# Patient Record
Sex: Female | Born: 1985
Health system: Southern US, Community
[De-identification: ages and names within clinical notes are randomized; demographics above are authoritative.]

## PROBLEM LIST (undated history)

## (undated) ENCOUNTER — Inpatient Hospital Stay (HOSPITAL_COMMUNITY): Payer: Self-pay

## (undated) DIAGNOSIS — F419 Anxiety disorder, unspecified: Secondary | ICD-10-CM

## (undated) DIAGNOSIS — H9190 Unspecified hearing loss, unspecified ear: Secondary | ICD-10-CM

## (undated) DIAGNOSIS — J329 Chronic sinusitis, unspecified: Secondary | ICD-10-CM

## (undated) DIAGNOSIS — T7840XA Allergy, unspecified, initial encounter: Secondary | ICD-10-CM

## (undated) HISTORY — DX: Allergy, unspecified, initial encounter: T78.40XA

## (undated) HISTORY — PX: MYOMECTOMY: SHX85

## (undated) HISTORY — DX: Chronic sinusitis, unspecified: J32.9

## (undated) HISTORY — DX: Unspecified hearing loss, unspecified ear: H91.90

## (undated) HISTORY — DX: Anxiety disorder, unspecified: F41.9

## (undated) HISTORY — PX: INNER EAR SURGERY: SHX679

---

## 2016-12-05 ENCOUNTER — Ambulatory Visit
Admission: EM | Admit: 2016-12-05 | Discharge: 2016-12-05 | Disposition: A | Discharge status: Home / Self Care | Payer: BLUE CROSS/BLUE SHIELD | Attending: Family Medicine | Admitting: Family Medicine

## 2016-12-05 ENCOUNTER — Encounter: Payer: Self-pay | Admitting: *Deleted

## 2016-12-05 DIAGNOSIS — H9201 Otalgia, right ear: Secondary | ICD-10-CM | POA: Diagnosis not present

## 2016-12-05 DIAGNOSIS — J0100 Acute maxillary sinusitis, unspecified: Secondary | ICD-10-CM

## 2016-12-05 MED ORDER — AMOXICILLIN-POT CLAVULANATE 875-125 MG PO TABS: 1.0000 | ORAL_TABLET | Freq: Two times a day (BID) | ORAL | 0 refills | Status: AC

## 2016-12-05 NOTE — ED Provider Notes (Signed)
CSN: [id number]     Arrival date & time 12/05/16  1655 History   First MD Initiated Contact with Patient 12/05/16 1722     Chief Complaint  Patient presents with  . Facial Pain   (Consider location/radiation/quality/duration/timing/severity/associated sxs/prior Treatment) Patient is a 31 year old female with no pertinent past history who presents to the clinic with for 5 day history of worsening sinus symptoms. Patient reports that the symptoms started initially as mild allergy symptoms on Thursday but they have progressed and worsened since then. Patient reported "having muffled hearing with ringing to the right ear today as well as mild pain in the right side teeth that began today as well. Patient reports positive pressure to the right side of face as well. Patient reports that these symptoms are very similar to her course and presentation with the last sinus infection she had last August or September. Patient reports that she has been taking Zyrtec and ibuprofen. Patient denies fever, chills, chest pain, and shortness of breath.      History reviewed. No pertinent past medical history. History reviewed. No pertinent surgical history. History reviewed. No pertinent family history. Social History  Substance Use Topics  . Smoking status: Never Smoker  . Smokeless tobacco: Never Used  . Alcohol use Yes   OB History    No data available     Review of Systems  Constitutional: Negative for chills and fever.  HENT: Positive for ear pain, sinus pain and sinus pressure.        Pain to teeth on the right side. Muffled hearing and ringing on the right side  Eyes: Positive for pain. Negative for itching.  Respiratory: Negative for cough and shortness of breath.   All other systems reviewed and are negative.   Allergies  Patient has no known allergies.  Home Medications   Prior to Admission medications   Medication Sig Start Date End Date Taking? Authorizing Provider   amoxicillin-clavulanate (AUGMENTIN) 875-125 MG tablet Take 1 tablet by mouth every 12 (twelve) hours. 12/05/16 12/12/16  Luvenia Redden, PA-C   Meds Ordered and Administered this Visit  Medications - No data to display  BP (!) 135/92 (BP Location: Left Arm)   Pulse 95   Temp 98 F (36.7 C) (Oral)   Resp 16   Ht 5\' 4"  (1.626 m)   Wt 181 lb (82.1 kg)   LMP 11/21/2016 (Exact Date)   SpO2 100%   BMI 31.07 kg/m  No data found.   Physical Exam  Constitutional: She is oriented to person, place, and time. She appears well-developed and well-nourished. No distress.  HENT:  Head: Normocephalic and atraumatic.  Right Ear: External ear and ear canal normal. Tympanic membrane is scarred and bulging. Tympanic membrane is not injected.  Left Ear: External ear and ear canal normal. Tympanic membrane is scarred.  Nose: Right sinus exhibits maxillary sinus tenderness and frontal sinus tenderness. Left sinus exhibits no maxillary sinus tenderness and no frontal sinus tenderness.  Mouth/Throat: Uvula is midline and oropharynx is clear and moist. No posterior oropharyngeal erythema.  Eyes: EOM are normal. Pupils are equal, round, and reactive to light.  Neck: Normal range of motion.  Cardiovascular: Normal rate and regular rhythm.   Pulmonary/Chest: Effort normal and breath sounds normal. No respiratory distress. She has no wheezes.  Abdominal: Soft.  Musculoskeletal: Normal range of motion.  Lymphadenopathy:    She has no cervical adenopathy.  Neurological: She is oriented to person, place, and time.  Skin: Skin  is warm and dry.  Psychiatric: She has a normal mood and affect. Her behavior is normal.    Urgent Care Course     Procedures (including critical care time)  Labs Review Labs Reviewed - No data to display  Imaging Review No results found.   MDM   1. Otalgia, right   2. Acute maxillary sinusitis, recurrence not specified    START taking these medications   Details   amoxicillin-clavulanate (AUGMENTIN) 875-125 MG tablet Take 1 tablet by mouth every 12 (twelve) hours., Starting Wed 12/05/2016, Until Wed 12/12/2016, Normal       Patient with symptoms and exam consistent with sinusitis that has been worsening over several days. Patient has been taking Zyrtec and ibuprofen with minimal improvement in symptoms. Given the progressive symptoms patient given a prescription for Augmentin for a 10 day course. Patient also advised she could take over-the-counter Flonase or Afrin for symptom management as well as over-the-counter nasal saline spray to help clear her sinus congestion. Patient advised to return to clinic or to PCP should her symptoms worsen or not improve. Patient verbalized understanding is in agreement with plan.  Luvenia Redden, PA-C    Luvenia Redden, PA-C 12/05/16 [phone]

## 2016-12-05 NOTE — ED Triage Notes (Signed)
Right sided facial pain, right ear is sore and muted. Hx of previous sinus infection that was similar.

## 2016-12-05 NOTE — Discharge Instructions (Signed)
-   Augmentin. 1 tablet twice a day until completed - can use OTC nasal sprays (Afrin and Flonase) for URI symptoms - can use OTC nasal saline spray to help clear our sinus congestion (attached) - continue home Zyrtec - continue ibuprofen or Tylenol for pain

## 2017-09-30 DIAGNOSIS — J0110 Acute frontal sinusitis, unspecified: Secondary | ICD-10-CM | POA: Diagnosis not present

## 2017-09-30 DIAGNOSIS — J0100 Acute maxillary sinusitis, unspecified: Secondary | ICD-10-CM | POA: Diagnosis not present

## 2017-10-17 DIAGNOSIS — S93501A Unspecified sprain of right great toe, initial encounter: Secondary | ICD-10-CM | POA: Diagnosis not present

## 2017-10-17 DIAGNOSIS — M79671 Pain in right foot: Secondary | ICD-10-CM | POA: Diagnosis not present

## 2017-12-05 ENCOUNTER — Other Ambulatory Visit: Payer: Self-pay

## 2017-12-05 ENCOUNTER — Emergency Department: Payer: BLUE CROSS/BLUE SHIELD

## 2017-12-05 ENCOUNTER — Emergency Department
Admission: EM | Admit: 2017-12-05 | Discharge: 2017-12-05 | Disposition: A | Discharge status: Home / Self Care | Payer: BLUE CROSS/BLUE SHIELD | Attending: Emergency Medicine | Admitting: Emergency Medicine

## 2017-12-05 ENCOUNTER — Encounter: Payer: Self-pay | Admitting: Emergency Medicine

## 2017-12-05 DIAGNOSIS — Y9389 Activity, other specified: Secondary | ICD-10-CM | POA: Diagnosis not present

## 2017-12-05 DIAGNOSIS — R2232 Localized swelling, mass and lump, left upper limb: Secondary | ICD-10-CM | POA: Diagnosis not present

## 2017-12-05 DIAGNOSIS — Y929 Unspecified place or not applicable: Secondary | ICD-10-CM | POA: Insufficient documentation

## 2017-12-05 DIAGNOSIS — S40022A Contusion of left upper arm, initial encounter: Secondary | ICD-10-CM | POA: Diagnosis not present

## 2017-12-05 DIAGNOSIS — Y999 Unspecified external cause status: Secondary | ICD-10-CM | POA: Diagnosis not present

## 2017-12-05 DIAGNOSIS — T148XXA Other injury of unspecified body region, initial encounter: Secondary | ICD-10-CM

## 2017-12-05 DIAGNOSIS — S4982XA Other specified injuries of left shoulder and upper arm, initial encounter: Secondary | ICD-10-CM | POA: Diagnosis present

## 2017-12-05 DIAGNOSIS — X58XXXA Exposure to other specified factors, initial encounter: Secondary | ICD-10-CM | POA: Diagnosis not present

## 2017-12-05 MED ORDER — OXYCODONE-ACETAMINOPHEN 5-325 MG PO TABS
1.0000 | ORAL_TABLET | Freq: Once | ORAL | Status: AC
  Administered 2017-12-05: 1 via ORAL
  Filled 2017-12-05: qty 1

## 2017-12-05 NOTE — ED Provider Notes (Signed)
[facility] Emergency Department Provider Note  ____________________________________________   First MD Initiated Contact with Patient 12/05/17 1105     (approximate)  I have reviewed the triage vital signs and the nursing notes.   HISTORY  Chief Complaint Arm Injury    HPI Denessa Cavan is a 32 y.o. female who presents emergency department.  She is concerned about the large bruise since on the left upper arm.  She states she donated plasma on Tuesday and the bruising and swelling has gotten much worse.  She is concerned that the area might still be actively bleeding.  She denies any fever or chills at this time.  States area does feel warm to touch.'s are tingling.  She denies any IV drug use  History reviewed. No pertinent past medical history.  There are no active problems to display for this patient.   History reviewed. No pertinent surgical history.  Prior to Admission medications   Not on File    Allergies Patient has no known allergies.  No family history on file.  Social History Social History   Tobacco Use  . Smoking status: Never Smoker  . Smokeless tobacco: Never Used  Substance Use Topics  . Alcohol use: Yes  . Drug use: Not on file    Review of Systems  Constitutional: No fever/chills Eyes: No visual changes. ENT: No sore throat. Respiratory: Denies cough Genitourinary: Negative for dysuria. Musculoskeletal: Negative for back pain. Skin: Negative for rash.  Positive for large amount of bruising on the left upper arm    ____________________________________________   PHYSICAL EXAM:  VITAL SIGNS: ED Triage Vitals  Enc Vitals Group     BP 12/05/17 1100 130/72     Pulse Rate 12/05/17 1100 79     Resp --      Temp 12/05/17 1100 98.4 F (36.9 C)     Temp Source 12/05/17 1100 Oral     SpO2 12/05/17 1100 99 %     Weight 12/05/17 1100 202 lb (91.6 kg)     Height 12/05/17 1100 5\' 4"  (1.626 m)     Head  Circumference --      Peak Flow --      Pain Score 12/05/17 1044 7     Pain Loc --      Pain Edu? --      Excl. in [place]? --     Constitutional: Alert and oriented. Well appearing and in no acute distress. Eyes: Conjunctivae are normal.  Head: Atraumatic. Nose: No congestion/rhinnorhea. Mouth/Throat: Mucous membranes are moist.   Cardiovascular: Normal rate, regular rhythm.  Heart sounds are normal Respiratory: Normal respiratory effort.  No retractions, lungs clear all station GU: deferred Musculoskeletal: FROM all extremities, warm and well perfused.  The left upper arm is swollen and bruised from the antecubital to the bicep and around to the back of the arm.  She has full range of motion of the arm.  She is neurovascularly intact. Neurologic:  Normal speech and language.  Skin:  Skin is warm, dry and intact. No rash noted.  Positive for large hematoma in the left upper arm.  No track marks or IV marks are noted. Psychiatric: Mood and affect are normal. Speech and behavior are normal.  ____________________________________________   LABS (all labs ordered are listed, but only abnormal results are displayed)  Labs Reviewed - No data to display ____________________________________________   ____________________________________________  RADIOLOGY  Ultrasound of the left upper arm shows a hematoma but the  vascularity is intact  ____________________________________________   PROCEDURES  Procedure(s) performed: No  Procedures    ____________________________________________   INITIAL IMPRESSION / ASSESSMENT AND PLAN / ED COURSE  Pertinent labs & imaging results that were available during my care of the patient were reviewed by me and considered in my medical decision making (see chart for details).  Patient is a 32 year old female presented emergency department complaining of left arm pain.  She donated plasma 2 days ago and now has a very large bruise.  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  She states she was very concerned about the cleanliness and also that they did not perform the procedure as well as the way she used to visit.  On physical exam the left upper arm has a very large hematoma which is warm to touch.  Ultrasound of the left arm shows a hematoma  Explained the ultrasound results to the patient.  Also instructed her to return if the area is worsening.  Explained to her that a DVT may not be present now but could develop within a week.  She states she understands will comply with her instructions.  She is to apply ice to the area and then in 2 days she can start using warm compresses.  She is to follow-up with her regular doctor or orthopedics if the area becomes jellylike so it could be drained.  She states she understands most discharged in stable condition     As part of my medical decision making, I reviewed the following data within the [place] notes reviewed and incorporated, Radiograph reviewed ultrasound of the left arm shows a hematoma, Notes from prior ED visits and [place] Controlled Substance Database  ____________________________________________   FINAL CLINICAL IMPRESSION(S) / ED DIAGNOSES  Final diagnoses:  Hematoma      NEW MEDICATIONS STARTED DURING THIS VISIT:  There are no discharge medications for this patient.    Note:  This document was prepared using Dragon voice recognition software and may include unintentional dictation errors.    Versie Starks, PA-C 12/05/17 1314    Earleen Newport, MD 12/05/17 [phone]

## 2017-12-05 NOTE — Discharge Instructions (Addendum)
Follow-up with your regular doctor if you are not better in 2 to 3 days.  If the arm is worsening and becomes more red and swollen please return to the emergency department.  Take over-the-counter pain medication as needed.  Apply ice for 2 days.  Then you may change to warm compresses.

## 2017-12-05 NOTE — ED Notes (Addendum)
See triage note    Having pain to left arm   Large bruised area with swelling noted to left upper arm  States she donated plasma recently

## 2017-12-05 NOTE — ED Triage Notes (Signed)
dontated plasma.  Since then arm increasing swelling around area--left arm

## 2018-10-14 ENCOUNTER — Telehealth: Payer: Self-pay

## 2018-10-14 NOTE — Telephone Encounter (Signed)
Copied from [place] [id number]. Topic: Appointment Scheduling - New Patient >> Oct 14, 2018  9:35 AM Virl Axe D wrote: New patient has been scheduled for your office. Provider: Aundra Dubin Date of Appointment: 12/12/18  Route to department's PEC pool.

## 2018-12-12 ENCOUNTER — Ambulatory Visit: Payer: BLUE CROSS/BLUE SHIELD | Admitting: Internal Medicine

## 2019-01-14 ENCOUNTER — Other Ambulatory Visit: Payer: Self-pay

## 2019-01-14 ENCOUNTER — Encounter: Payer: Self-pay | Admitting: Internal Medicine

## 2019-01-14 ENCOUNTER — Ambulatory Visit (INDEPENDENT_AMBULATORY_CARE_PROVIDER_SITE_OTHER): Payer: BLUE CROSS/BLUE SHIELD | Admitting: Internal Medicine

## 2019-01-14 DIAGNOSIS — H9193 Unspecified hearing loss, bilateral: Secondary | ICD-10-CM | POA: Insufficient documentation

## 2019-01-14 DIAGNOSIS — J309 Allergic rhinitis, unspecified: Secondary | ICD-10-CM

## 2019-01-14 DIAGNOSIS — N946 Dysmenorrhea, unspecified: Secondary | ICD-10-CM | POA: Insufficient documentation

## 2019-01-14 DIAGNOSIS — J320 Chronic maxillary sinusitis: Secondary | ICD-10-CM

## 2019-01-14 DIAGNOSIS — Z1389 Encounter for screening for other disorder: Secondary | ICD-10-CM

## 2019-01-14 DIAGNOSIS — Z30431 Encounter for routine checking of intrauterine contraceptive device: Secondary | ICD-10-CM

## 2019-01-14 DIAGNOSIS — Z124 Encounter for screening for malignant neoplasm of cervix: Secondary | ICD-10-CM

## 2019-01-14 DIAGNOSIS — E559 Vitamin D deficiency, unspecified: Secondary | ICD-10-CM

## 2019-01-14 DIAGNOSIS — H1013 Acute atopic conjunctivitis, bilateral: Secondary | ICD-10-CM

## 2019-01-14 DIAGNOSIS — Z1329 Encounter for screening for other suspected endocrine disorder: Secondary | ICD-10-CM

## 2019-01-14 DIAGNOSIS — Z0000 Encounter for general adult medical examination without abnormal findings: Secondary | ICD-10-CM

## 2019-01-14 DIAGNOSIS — Z13220 Encounter for screening for lipoid disorders: Secondary | ICD-10-CM

## 2019-01-14 MED ORDER — MONTELUKAST SODIUM 10 MG PO TABS: 10.0000 mg | ORAL_TABLET | Freq: Every day | ORAL | 3 refills | Status: DC

## 2019-01-14 MED ORDER — OLOPATADINE HCL 0.2 % OP SOLN: 1.0000 [drp] | Freq: Every day | OPHTHALMIC | 7 refills | Status: DC

## 2019-01-14 NOTE — Progress Notes (Signed)
Virtual Visit via Video Note  I connected with Nichole Cain  on 01/14/19 at  9:54 AM EDT by a video enabled telemedicine application and verified that I am speaking with the correct person using two identifiers.  Location patient: home Location provider:work  Persons participating in the virtual visit: patient, provider  I discussed the limitations of evaluation and management by telemedicine and the availability of in person appointments. The patient expressed understanding and agreed to proceed.   HPI: Allergic  both eyes c/o watery eyes daily using otc [facility] allergy eye drops w/o relief will try  Allergic rhinitis/right maxillary sinusitis unspecified trigger -prn zyrtec 10 mg qd is not helping she did not have issues with sinus on right side flaring until moved to [facility]. She reports right cheek sinus flares 2-3 x per year.   Loss of hearing will refer to ENT in [place] needs audiology check has been 2 years wears hearing aids s/p surgery for tubes in ear 9x and skin grafts on b/l ear drums   IUD check up feels not in placed due to change in 2023. Also due for pap smear and has painful menses despite IUD    ROS: See pertinent positives and negatives per HPI. General: no weight change, no fever  HEENT: no sore throat, b/l hearing loss + CV: no chest pain  Lungs: no sob Ab: no pain GU: painful menses + painful IUD+ MSK: no joint pain Psych: no depression/anxiety   Past Medical History:  Diagnosis Date  . Allergy   . Hearing loss    wears hearing aids b/l   . Sinusitis    right maxillary    Past Surgical History:  Procedure Laterality Date  . INNER EAR SURGERY     tubes x 9 and skin grafts in ear drums    Family History  Problem Relation Age of Onset  . Anxiety disorder Mother   . Depression Mother   . Heart disease Mother        MI  . Hyperlipidemia Mother   . Hypertension Mother   . Heart disease Maternal Grandfather        CHF  . Cancer Paternal Grandfather         testicular cancer    SOCIAL HX:  [place] [facility] office  No kids  Single  Mom is Glenna Fellows  Former smoker from 24s x 10 + years max 1 pk in 2 days to 1 ppd  Born in [place] lived in [place] but moved back to [place]    Current Outpatient Medications:  .  cetirizine (ZYRTEC) 10 MG tablet, Take 10 mg by mouth daily as needed for allergies., Disp: , Rfl:  .  montelukast (SINGULAIR) 10 MG tablet, Take 1 tablet (10 mg total) by mouth at bedtime., Disp: 90 tablet, Rfl: 3 .  Olopatadine HCl (PATADAY) 0.2 % SOLN, Apply 1 drop to eye daily. Max, Disp: 2.5 mL, Rfl: 7  EXAM:  VITALS per patient if applicable:  GENERAL: alert, oriented, appears well and in no acute distress  HEENT: atraumatic, conjunttiva clear, no obvious abnormalities on inspection of external nose and ears  NECK: normal movements of the head and neck  LUNGS: on inspection no signs of respiratory distress, breathing rate appears normal, no obvious gross SOB, gasping or wheezing  CV: no obvious cyanosis  MS: moves all visible extremities without noticeable abnormality  PSYCH/NEURO: pleasant and cooperative, no obvious depression or anxiety, speech and thought processing grossly intact  ASSESSMENT AND PLAN:  Discussed the following assessment and plan:  Allergic conjunctivitis of both eyes - Plan: using otc [facility] allergy eye drops w/o relief will try Olopatadine HCl (PATADAY) 0.2 % SOLN Allergic rhinitis, unspecified seasonality, unspecified trigger - Plan: montelukast (SINGULAIR) 10 MG tablet, prn zyrtec 10 mg qd  H/o sinusitis right maxillary flares 2-3 x per year   Loss of hearing will refer to ENT in [place] needs audiology check has been 2 years wears hearing aids  IUD check up feels not in placed due to change in 2023 - Plan: Ambulatory referral to Obstetrics / Gynecology Dr. Leonides Schanz or [facility] also due for pap  Routine cervical smear - Plan: Ambulatory referral to Obstetrics / Gynecology Painful menses    HM utd flu shot  Tdap had in 2015 in [place] per pt  Declines STD check   sch fasting labs  Pap referred [facility] and also to check IUD placement no h/o abnormal pap   I discussed the assessment and treatment plan with the patient. The patient was provided an opportunity to ask questions and all were answered. The patient agreed with the plan and demonstrated an understanding of the instructions.   The patient was advised to call back or seek an in-person evaluation if the symptoms worsen or if the condition fails to improve as anticipated.  Time spent 25 minutes Delorise Jackson, MD

## 2019-01-15 NOTE — Patient Instructions (Signed)
Sinusitis, Adult Sinusitis is inflammation of your sinuses. Sinuses are hollow spaces in the bones around your face. Your sinuses are located:  Around your eyes.  In the middle of your forehead.  Behind your nose.  In your cheekbones. Mucus normally drains out of your sinuses. When your nasal tissues become inflamed or swollen, mucus can become trapped or blocked. This allows bacteria, viruses, and fungi to grow, which leads to infection. Most infections of the sinuses are caused by a virus. Sinusitis can develop quickly. It can last for up to 4 weeks (acute) or for more than 12 weeks (chronic). Sinusitis often develops after a cold. What are the causes? This condition is caused by anything that creates swelling in the sinuses or stops mucus from draining. This includes:  Allergies.  Asthma.  Infection from bacteria or viruses.  Deformities or blockages in your nose or sinuses.  Abnormal growths in the nose (nasal polyps).  Pollutants, such as chemicals or irritants in the air.  Infection from fungi (rare). What increases the risk? You are more likely to develop this condition if you:  Have a weak body defense system (immune system).  Do a lot of swimming or diving.  Overuse nasal sprays.  Smoke. What are the signs or symptoms? The main symptoms of this condition are pain and a feeling of pressure around the affected sinuses. Other symptoms include:  Stuffy nose or congestion.  Thick drainage from your nose.  Swelling and warmth over the affected sinuses.  Headache.  Upper toothache.  A cough that may get worse at night.  Extra mucus that collects in the throat or the back of the nose (postnasal drip).  Decreased sense of smell and taste.  Fatigue.  A fever.  Sore throat.  Bad breath. How is this diagnosed? This condition is diagnosed based on:  Your symptoms.  Your medical history.  A physical exam.  Tests to find out if your condition is  acute or chronic. This may include: ? Checking your nose for nasal polyps. ? Viewing your sinuses using a device that has a light (endoscope). ? Testing for allergies or bacteria. ? Imaging tests, such as an MRI or CT scan. In rare cases, a bone biopsy may be done to rule out more serious types of fungal sinus disease. How is this treated? Treatment for sinusitis depends on the cause and whether your condition is chronic or acute.  If caused by a virus, your symptoms should go away on their own within 10 days. You may be given medicines to relieve symptoms. They include: ? Medicines that shrink swollen nasal passages (topical intranasal decongestants). ? Medicines that treat allergies (antihistamines). ? A spray that eases inflammation of the nostrils (topical intranasal corticosteroids). ? Rinses that help get rid of thick mucus in your nose (nasal saline washes).  If caused by bacteria, your health care provider may recommend waiting to see if your symptoms improve. Most bacterial infections will get better without antibiotic medicine. You may be given antibiotics if you have: ? A severe infection. ? A weak immune system.  If caused by narrow nasal passages or nasal polyps, you may need to have surgery. Follow these instructions at home: Medicines  Take, use, or apply over-the-counter and prescription medicines only as told by your health care provider. These may include nasal sprays.  If you were prescribed an antibiotic medicine, take it as told by your health care provider. Do not stop taking the antibiotic even if you start   to feel better. Hydrate and humidify   Drink enough fluid to keep your urine pale yellow. Staying hydrated will help to thin your mucus.  Use a cool mist humidifier to keep the humidity level in your home above 50%.  Inhale steam for 10-15 minutes, 3-4 times a day, or as told by your health care provider. You can do this in the bathroom while a hot shower is  running.  Limit your exposure to cool or dry air. Rest  Rest as much as possible.  Sleep with your head raised (elevated).  Make sure you get enough sleep each night. General instructions   Apply a warm, moist washcloth to your face 3-4 times a day or as told by your health care provider. This will help with discomfort.  Wash your hands often with soap and water to reduce your exposure to germs. If soap and water are not available, use hand sanitizer.  Do not smoke. Avoid being around people who are smoking (secondhand smoke).  Keep all follow-up visits as told by your health care provider. This is important. Contact a health care provider if:  You have a fever.  Your symptoms get worse.  Your symptoms do not improve within 10 days. Get help right away if:  You have a severe headache.  You have persistent vomiting.  You have severe pain or swelling around your face or eyes.  You have vision problems.  You develop confusion.  Your neck is stiff.  You have trouble breathing. Summary  Sinusitis is soreness and inflammation of your sinuses. Sinuses are hollow spaces in the bones around your face.  This condition is caused by nasal tissues that become inflamed or swollen. The swelling traps or blocks the flow of mucus. This allows bacteria, viruses, and fungi to grow, which leads to infection.  If you were prescribed an antibiotic medicine, take it as told by your health care provider. Do not stop taking the antibiotic even if you start to feel better.  Keep all follow-up visits as told by your health care provider. This is important. This information is not intended to replace advice given to you by your health care provider. Make sure you discuss any questions you have with your health care provider. Document Released: 07/30/2005 Document Revised: 12/30/2017 Document Reviewed: 12/30/2017 Elsevier Interactive Patient Education  2019 Elsevier  Inc.  Dysmenorrhea  Dysmenorrhea refers to cramps caused by the muscles of the uterus tightening (contracting) during a menstrual period. Dysmenorrhea may be mild, or it may be severe enough to interfere with everyday activities for a few days each month. Primary dysmenorrhea is menstrual cramps that last a couple of days when you start having menstrual periods or soon after. This often begins after a teenager starts having her period. As a woman gets older or has a baby, the cramps will usually lessen or disappear. Secondary dysmenorrhea begins later in life and is caused by a disorder in the reproductive system. It lasts longer, and it may cause more pain than primary dysmenorrhea. The pain may start before the period and last a few days after the period. What are the causes? Dysmenorrhea is usually caused by an underlying problem, such as:  The tissue that lines the uterus (endometrium) growing outside of the uterus in other areas of the body (endometriosis).  Endometrial tissue growing into the muscular walls of the uterus (adenomyosis).  Blood vessels in the pelvis becoming filled with blood just before the menstrual period (pelvic congestive syndrome).  Overgrowth of cells (polyps) in the endometrium or the lower part of the uterus (cervix).  The uterus dropping down into the vagina (prolapse) due to stretched or weak muscles.  Bladder problems, such as infection or inflammation.  Intestinal problems, such as a tumor or irritable bowel syndrome.  Cancer of the reproductive organs or bladder.  A severely tipped uterus.  A cervix that is closed or has a very small opening.  Noncancerous (benign) tumors of the uterus (fibroids).  Pelvic inflammatory disease (PID).  Pelvic scarring (adhesions) from a previous surgery.  An ovarian cyst.  An IUD (intrauterine device). What increases the risk? You are more likely to develop this condition if:  You are younger than age  62.  You started puberty early.  You have irregular or heavy bleeding.  You have never given birth.  You have a family history of dysmenorrhea.  You smoke. What are the signs or symptoms? Symptoms of this condition include:  Cramping, throbbing pain, or a feeling of fullness in the lower abdomen.  Lower back pain.  Periods lasting for longer than 7 days.  Headaches.  Bloating.  Fatigue.  Nausea or vomiting.  Diarrhea.  Sweating or dizziness.  Loose stools. How is this diagnosed? This condition may be diagnosed based on:  Your symptoms.  Your medical history.  A physical exam.  Blood tests.  A Pap test. This is a test in which cells from the cervix are tested for signs of cancer or infection.  A pregnancy test.  Imaging tests, such as: ? Ultrasound. ? A procedure to remove and examine a sample of endometrial tissue (dilation and curettage, D&C). ? A procedure to visually examine the inside of:  The uterus (hysteroscopy).  The abdomen or pelvis (laparoscopy).  The bladder (cystoscopy).  The intestine (colonoscopy).  The stomach (gastroscopy). ? X-rays. ? CT scan. ? MRI. How is this treated? Treatment depends on the cause of the dysmenorrhea. Treatment may include:  Pain medicine prescribed by your health care provider.  Birth control pills that contain the hormone progesterone.  An IUD that contains the hormone progesterone.  Medicines to control bleeding.  Hormone replacement therapy.  NSAIDs. These may help to stop the production of hormones that cause cramps.  Antidepressant medicines.  Surgery to remove adhesions, endometriosis, ovarian cysts, fibroids, or the entire uterus (hysterectomy).  Injections of progesterone to stop the menstrual period.  A procedure to destroy the endometrium (endometrial ablation).  A procedure to cut the nerves in the bottom of the spine (sacrum) that go to the reproductive organs (presacral  neurectomy).  A procedure to apply an electric current to nerves in the sacrum (sacral nerve stimulation).  Exercise and physical therapy.  Meditation and yoga therapy.  Acupuncture. Work with your health care provider to determine what treatment or combination of treatments is best for you. Follow these instructions at home: Relieving pain and cramping  Apply heat to your lower back or abdomen when you experience pain or cramps. Use the heat source that your health care provider recommends, such as a moist heat pack or a heating pad. ? Place a towel between your skin and the heat source. ? Leave the heat on for 20-30 minutes. ? Remove the heat if your skin turns bright red. This is especially important if you are unable to feel pain, heat, or cold. You may have a greater risk of getting burned. ? Do not sleep with a heating pad on.  Do aerobic exercises, such as  walking, swimming, or biking. This can help to relieve cramps.  Massage your lower back or abdomen to help relieve pain. General instructions  Take over-the-counter and prescription medicines only as told by your health care provider.  Do not drive or use heavy machinery while taking prescription pain medicine.  Avoid alcohol and caffeine during and right before your menstrual period. These can make cramps worse.  Do not use any products that contain nicotine or tobacco, such as cigarettes and e-cigarettes. If you need help quitting, ask your health care provider.  Keep all follow-up visits as told by your health care provider. This is important. Contact a health care provider if:  You have pain that gets worse or does not get better with medicine.  You have pain with sex.  You develop nausea or vomiting with your period that is not controlled with medicine. Get help right away if:  You faint. Summary  Dysmenorrhea refers to cramps caused by the muscles of the uterus tightening (contracting) during a menstrual  period.  Dysmenorrhea may be mild, or it may be severe enough to interfere with everyday activities for a few days each month.  Treatment depends on the cause of the dysmenorrhea.  Work with your health care provider to determine what treatment or combination of treatments is best for you. This information is not intended to replace advice given to you by your health care provider. Make sure you discuss any questions you have with your health care provider. Document Released: 07/30/2005 Document Revised: 09/01/2016 Document Reviewed: 09/01/2016 Elsevier Interactive Patient Education  2019 Elsevier Inc.  Montelukast oral tablets What is this medicine? MONTELUKAST (mon te LOO kast) is used to prevent and treat the symptoms of asthma. It is also used to treat allergies. Do not use for an acute asthma attack. This medicine may be used for other purposes; ask your health care provider or pharmacist if you have questions. COMMON BRAND NAME(S): Singulair What should I tell my health care provider before I take this medicine? They need to know if you have any of these conditions: -liver disease -an unusual or allergic reaction to montelukast, other medicines, foods, dyes, or preservatives -pregnant or trying to get pregnant -breast-feeding How should I use this medicine? This medicine should be given by mouth. Follow the directions on the prescription label. Take this medicine at the same time every day. You may take this medicine with or without meals. Do not chew the tablets. Do not stop taking your medicine unless your doctor tells you to. Talk to your pediatrician regarding the use of this medicine in children. Special care may be needed. While this drug may be prescribed for children as young as 10 years of age for selected conditions, precautions do apply. Overdosage: If you think you have taken too much of this medicine contact a poison control center or emergency room at once. NOTE: This  medicine is only for you. Do not share this medicine with others. What if I miss a dose? If you miss a dose, take it as soon as you can. If it is almost time for your next dose, take only that dose. Do not take double or extra doses. What may interact with this medicine? -anti-infectives like rifampin and rifabutin -medicines for seizures like phenytoin, phenobarbital, and carbamazepine This list may not describe all possible interactions. Give your health care provider a list of all the medicines, herbs, non-prescription drugs, or dietary supplements you use. Also tell them if you smoke,  drink alcohol, or use illegal drugs. Some items may interact with your medicine. What should I watch for while using this medicine? Visit your doctor or health care professional for regular checks on your progress. Tell your doctor or health care professional if your allergy or asthma symptoms do not improve. Take your medicine even when you do not have symptoms. Do not stop taking any of your medicine(s) unless your doctor tells you to. If you have asthma, talk to your doctor about what to do in an acute asthma attack. Always have your inhaled rescue medicine for asthma attacks with you. Patients and their families should watch for new or worsening thoughts of suicide or depression. Also watch for sudden changes in feelings such as feeling anxious, agitated, panicky, irritable, hostile, aggressive, impulsive, severely restless, overly excited and hyperactive, or not being able to sleep. Any worsening of mood or thoughts of suicide or dying should be reported to your health care professional right away. What side effects may I notice from receiving this medicine? Side effects that you should report to your doctor or health care professional as soon as possible: -allergic reactions like skin rash or hives, or swelling of the face, lips, or tongue -breathing problems -changes in emotions or  moods -confusion -depressed mood -fever or infection -hallucinations -joint pain -painful lumps under the skin -pain, tingling, numbness in the hands or feet -redness, blistering, peeling, or loosening of the skin, including inside the mouth -restlessness -seizures -sleep walking -signs and symptoms of infection like fever; chills; cough; sore throat; flu-like illness -signs and symptoms of liver injury like dark yellow or brown urine; general ill feeling or flu-like symptoms; light-colored stools; loss of appetite; nausea; right upper belly pain; unusually weak or tired; yellowing of the eyes or skin -sinus pain or swelling -stuttering -suicidal thoughts or other mood changes -tremors -trouble sleeping -uncontrolled muscle movements -unusual bleeding or bruising -vivid or bad dreams Side effects that usually do not require medical attention (report to your doctor or health care professional if they continue or are bothersome): -dizziness -drowsiness -headache -runny nose -stomach upset -tiredness This list may not describe all possible side effects. Call your doctor for medical advice about side effects. You may report side effects to FDA at 1-800-FDA-1088. Where should I keep my medicine? Keep out of the reach of children. Store at room temperature between 15 and 30 degrees C (59 and 86 degrees F). Protect from light and moisture. Keep this medicine in the original bottle. Throw away any unused medicine after the expiration date. NOTE: This sheet is a summary. It may not cover all possible information. If you have questions about this medicine, talk to your doctor, pharmacist, or health care provider.  2019 Elsevier/Gold Standard (2018-03-25 13:51:04)

## 2019-03-05 ENCOUNTER — Telehealth: Payer: Self-pay | Admitting: Internal Medicine

## 2019-03-05 DIAGNOSIS — E669 Obesity, unspecified: Secondary | ICD-10-CM | POA: Diagnosis not present

## 2019-03-05 DIAGNOSIS — Z01419 Encounter for gynecological examination (general) (routine) without abnormal findings: Secondary | ICD-10-CM | POA: Diagnosis not present

## 2019-03-05 DIAGNOSIS — N921 Excessive and frequent menstruation with irregular cycle: Secondary | ICD-10-CM | POA: Diagnosis not present

## 2019-03-05 DIAGNOSIS — Z124 Encounter for screening for malignant neoplasm of cervix: Secondary | ICD-10-CM | POA: Diagnosis not present

## 2019-03-05 DIAGNOSIS — R87610 Atypical squamous cells of undetermined significance on cytologic smear of cervix (ASC-US): Secondary | ICD-10-CM | POA: Diagnosis not present

## 2019-03-05 DIAGNOSIS — N939 Abnormal uterine and vaginal bleeding, unspecified: Secondary | ICD-10-CM | POA: Diagnosis not present

## 2019-03-05 NOTE — Telephone Encounter (Signed)
I called pt and left vm to call ofc to schedule sch fasting lab appt asap  sch f/u in 3-6 months   Please call pt to get new insurance card info still has [employer]

## 2019-03-13 DIAGNOSIS — D251 Intramural leiomyoma of uterus: Secondary | ICD-10-CM | POA: Diagnosis not present

## 2019-03-13 DIAGNOSIS — D250 Submucous leiomyoma of uterus: Secondary | ICD-10-CM | POA: Diagnosis not present

## 2019-03-13 DIAGNOSIS — Z01419 Encounter for gynecological examination (general) (routine) without abnormal findings: Secondary | ICD-10-CM | POA: Diagnosis not present

## 2019-03-13 DIAGNOSIS — N939 Abnormal uterine and vaginal bleeding, unspecified: Secondary | ICD-10-CM | POA: Diagnosis not present

## 2019-03-27 DIAGNOSIS — Z30432 Encounter for removal of intrauterine contraceptive device: Secondary | ICD-10-CM | POA: Diagnosis not present

## 2019-03-27 DIAGNOSIS — D251 Intramural leiomyoma of uterus: Secondary | ICD-10-CM | POA: Diagnosis not present

## 2019-03-27 DIAGNOSIS — N920 Excessive and frequent menstruation with regular cycle: Secondary | ICD-10-CM | POA: Diagnosis not present

## 2019-03-27 DIAGNOSIS — Z30011 Encounter for initial prescription of contraceptive pills: Secondary | ICD-10-CM | POA: Diagnosis not present

## 2019-04-14 ENCOUNTER — Telehealth: Payer: Self-pay | Admitting: Internal Medicine

## 2019-04-14 NOTE — Telephone Encounter (Signed)
I called pt and left a vm to call ofc to schedule sch fasting lab appt asap

## 2019-06-11 DIAGNOSIS — N939 Abnormal uterine and vaginal bleeding, unspecified: Secondary | ICD-10-CM | POA: Diagnosis not present

## 2019-06-11 DIAGNOSIS — N920 Excessive and frequent menstruation with regular cycle: Secondary | ICD-10-CM | POA: Diagnosis not present

## 2019-07-14 DIAGNOSIS — N96 Recurrent pregnancy loss: Secondary | ICD-10-CM | POA: Diagnosis not present

## 2019-07-14 DIAGNOSIS — Z3141 Encounter for fertility testing: Secondary | ICD-10-CM | POA: Diagnosis not present

## 2019-07-14 DIAGNOSIS — D250 Submucous leiomyoma of uterus: Secondary | ICD-10-CM | POA: Diagnosis not present

## 2019-07-14 DIAGNOSIS — D559 Anemia due to enzyme disorder, unspecified: Secondary | ICD-10-CM | POA: Diagnosis not present

## 2019-07-14 DIAGNOSIS — N972 Female infertility of uterine origin: Secondary | ICD-10-CM | POA: Diagnosis not present

## 2019-07-16 ENCOUNTER — Encounter: Payer: Self-pay | Admitting: Internal Medicine

## 2019-07-16 ENCOUNTER — Ambulatory Visit (INDEPENDENT_AMBULATORY_CARE_PROVIDER_SITE_OTHER): Payer: BC Managed Care – PPO | Admitting: Internal Medicine

## 2019-07-16 VITALS — BP 118/80 | Ht 64.0 in | Wt 178.0 lb

## 2019-07-16 DIAGNOSIS — D219 Benign neoplasm of connective and other soft tissue, unspecified: Secondary | ICD-10-CM | POA: Insufficient documentation

## 2019-07-16 DIAGNOSIS — H9193 Unspecified hearing loss, bilateral: Secondary | ICD-10-CM

## 2019-07-16 DIAGNOSIS — E785 Hyperlipidemia, unspecified: Secondary | ICD-10-CM | POA: Diagnosis not present

## 2019-07-16 DIAGNOSIS — Z0000 Encounter for general adult medical examination without abnormal findings: Secondary | ICD-10-CM | POA: Insufficient documentation

## 2019-07-16 NOTE — Progress Notes (Addendum)
Virtual Visit via Video Note  I connected with Nichole Cain  on 07/16/19 at  9:50 AM EST by a video enabled telemedicine application and verified that I am speaking with the correct person using two identifiers.  Location patient: home Location provider:work or home office Persons participating in the virtual visit: patient, provider  I discussed the limitations of evaluation and management by telemedicine and the availability of in person appointments. The patient expressed understanding and agreed to proceed.   HPI: Annual 1. B/l hearing aids with loss and chronic sinusitis wants to see ENT but not [facility], needs to see audiology  2. Fibroids pending myomectomy 08/2019 [facility] Dr. Erasmo Downer cain pt agreeable to get copy of labs    ROS: See pertinent positives and negatives per HPI. General: wt down 178 lbs down 30 lbs trying  HEENT: no sore throat, +hearing loss chronic CV: no chest pain  Lungs: no sob  GI: no issues  GU: +fibroids  MSK: no jt pain  Psych: no anxiety/depression Neuro: no h/a  Skin: no issues   Past Medical History:  Diagnosis Date  . Allergy   . Hearing loss    wears hearing aids b/l   . Sinusitis    right maxillary    Past Surgical History:  Procedure Laterality Date  . INNER EAR SURGERY     tubes x 9 and skin grafts in ear drums    Family History  Problem Relation Age of Onset  . Anxiety disorder Mother   . Depression Mother   . Heart disease Mother        MI  . Hyperlipidemia Mother   . Hypertension Mother   . Heart disease Maternal Grandfather        CHF  . Cancer Paternal Grandfather        testicular cancer    SOCIAL HX:   [facility] office  No kids  Single  Mom is Glenna Fellows  Former smoker from 37s x 10 + years max 1 pk in 2 days to 1 ppd  Born in [place] lived in [place] but moved back to [place]   Current Outpatient Medications:  .  cetirizine (ZYRTEC) 10 MG tablet, Take 10 mg by mouth daily as needed for  allergies., Disp: , Rfl:  .  montelukast (SINGULAIR) 10 MG tablet, Take 1 tablet (10 mg total) by mouth at bedtime., Disp: 90 tablet, Rfl: 3  EXAM:  VITALS per patient if applicable:  GENERAL: alert, oriented, appears well and in no acute distress  HEENT: atraumatic, conjunttiva clear, no obvious abnormalities on inspection of external nose and ears  NECK: normal movements of the head and neck  LUNGS: on inspection no signs of respiratory distress, breathing rate appears normal, no obvious gross SOB, gasping or wheezing  CV: no obvious cyanosis  MS: moves all visible extremities without noticeable abnormality  PSYCH/NEURO: pleasant and cooperative, no obvious depression or anxiety, speech and thought processing grossly intact  ASSESSMENT AND PLAN:  Discussed the following assessment and plan:  Annual physical exam flu shot none in system for 2020  Tdap had in 2015 in [facility] per pt  Declines STD check   Had labs 03/13/19 and 06/11/19 [facility] ob[facility]  Pap 03/05/19 HPV neg and ASCUS + f/u in 1 year  Seeing [facility] in [place] Dr. Christene Lye for myomectomy 08/2019 [facility] for labs per pt had vit D there and other labs  -vitamin D 25.5 07/14/2019 TSH 0.683  CBC 5.3/11.8/36.9/306  Had lipid 03/13/2019  cmet 03/13/19  Will need UA in future  IUD out as of 02/2019 [facility]  Dermatology consider in future  rec healthy diet and exercise  HLD total cholesterol 206 03/13/2019 rec healthy diet and exercise   Fibroids -f/u [facility] in [place] pending myomectomy 08/2019   Bilateral hearing loss, unspecified hearing loss type - Plan: Ambulatory referral to ENT in [place]    -we discussed possible serious and likely etiologies, options for evaluation and workup, limitations of telemedicine visit vs in person visit, treatment, treatment risks and precautions. Pt prefers to treat via telemedicine empirically rather then risking or undertaking an in person visit at this moment. Patient agrees to seek prompt in person care  if worsening, new symptoms arise, or if is not improving with treatment.   I discussed the assessment and treatment plan with the patient. The patient was provided an opportunity to ask questions and all were answered. The patient agreed with the plan and demonstrated an understanding of the instructions.   The patient was advised to call back or seek an in-person evaluation if the symptoms worsen or if the condition fails to improve as anticipated.  Time spent 20 minutes  Delorise Jackson, MD

## 2019-07-16 NOTE — Patient Instructions (Signed)
High Cholesterol  High cholesterol is a condition in which the blood has high levels of a white, waxy, fat-like substance (cholesterol). The human body needs small amounts of cholesterol. The liver makes all the cholesterol that the body needs. Extra (excess) cholesterol comes from the food that we eat. Cholesterol is carried from the liver by the blood through the blood vessels. If you have high cholesterol, deposits (plaques) may build up on the walls of your blood vessels (arteries). Plaques make the arteries narrower and stiffer. Cholesterol plaques increase your risk for heart attack and stroke. Work with your health care provider to keep your cholesterol levels in a healthy range. What increases the risk? This condition is more likely to develop in people who:  Eat foods that are high in animal fat (saturated fat) or cholesterol.  Are overweight.  Are not getting enough exercise.  Have a family history of high cholesterol. What are the signs or symptoms? There are no symptoms of this condition. How is this diagnosed? This condition may be diagnosed from the results of a blood test.  If you are older than age 20, your health care provider may check your cholesterol every 4-6 years.  You may be checked more often if you already have high cholesterol or other risk factors for heart disease. The blood test for cholesterol measures:  "Bad" cholesterol (LDL cholesterol). This is the main type of cholesterol that causes heart disease. The desired level for LDL is less than 100.  "Good" cholesterol (HDL cholesterol). This type helps to protect against heart disease by cleaning the arteries and carrying the LDL away. The desired level for HDL is 60 or higher.  Triglycerides. These are fats that the body can store or burn for energy. The desired number for triglycerides is lower than 150.  Total cholesterol. This is a measure of the total amount of cholesterol in your blood, including LDL  cholesterol, HDL cholesterol, and triglycerides. A healthy number is less than 200. How is this treated? This condition is treated with diet changes, lifestyle changes, and medicines. Diet changes  This may include eating more whole grains, fruits, vegetables, nuts, and fish.  This may also include cutting back on red meat and foods that have a lot of added sugar. Lifestyle changes  Changes may include getting at least 40 minutes of aerobic exercise 3 times a week. Aerobic exercises include walking, biking, and swimming. Aerobic exercise along with a healthy diet can help you maintain a healthy weight.  Changes may also include quitting smoking. Medicines  Medicines are usually given if diet and lifestyle changes have failed to reduce your cholesterol to healthy levels.  Your health care provider may prescribe a statin medicine. Statin medicines have been shown to reduce cholesterol, which can reduce the risk of heart disease. Follow these instructions at home: Eating and drinking If told by your health care provider:  Eat chicken (without skin), fish, veal, shellfish, ground turkey breast, and round or loin cuts of red meat.  Do not eat fried foods or fatty meats, such as hot dogs and salami.  Eat plenty of fruits, such as apples.  Eat plenty of vegetables, such as broccoli, potatoes, and carrots.  Eat beans, peas, and lentils.  Eat grains such as barley, rice, couscous, and bulgur wheat.  Eat pasta without cream sauces.  Use skim or nonfat milk, and eat low-fat or nonfat yogurt and cheeses.  Do not eat or drink whole milk, cream, ice cream, egg yolks,   or hard cheeses.  Do not eat stick margarine or tub margarines that contain trans fats (also called partially hydrogenated oils).  Do not eat saturated tropical oils, such as coconut oil and palm oil.  Do not eat cakes, cookies, crackers, or other baked goods that contain trans fats.  General instructions  Exercise as  directed by your health care provider. Increase your activity level with activities such as gardening, walking, and taking the stairs.  Take over-the-counter and prescription medicines only as told by your health care provider.  Do not use any products that contain nicotine or tobacco, such as cigarettes and e-cigarettes. If you need help quitting, ask your health care provider.  Keep all follow-up visits as told by your health care provider. This is important. Contact a health care provider if:  You are struggling to maintain a healthy diet or weight.  You need help to start on an exercise program.  You need help to stop smoking. Get help right away if:  You have chest pain.  You have trouble breathing. This information is not intended to replace advice given to you by your health care provider. Make sure you discuss any questions you have with your health care provider. Document Released: 07/30/2005 Document Revised: 08/02/2017 Document Reviewed: 01/28/2016 Elsevier Patient Education  [place].  Cholesterol Content in Foods Cholesterol is a waxy, fat-like substance that helps to carry fat in the blood. The body needs cholesterol in small amounts, but too much cholesterol can cause damage to the arteries and heart. Most people should eat less than 200 milligrams (mg) of cholesterol a day. Foods with cholesterol  Cholesterol is found in animal-based foods, such as meat, seafood, and dairy. Generally, low-fat dairy and lean meats have less cholesterol than full-fat dairy and fatty meats. The milligrams of cholesterol per serving (mg per serving) of common cholesterol-containing foods are listed below. Meat and other proteins  Egg - one large whole egg has 186 mg.  Veal shank - 4 oz has 141 mg.  Lean ground [place] (93% lean) - 4 oz has 118 mg.  Fat-trimmed lamb loin - 4 oz has 106 mg.  Lean ground beef (90% lean) - 4 oz has 100 mg.  Lobster - 3.5 oz has 90 mg.  Pork  loin chops - 4 oz has 86 mg.  Canned salmon - 3.5 oz has 83 mg.  Fat-trimmed beef top loin - 4 oz has 78 mg.  Frankfurter - 1 frank (3.5 oz) has 77 mg.  Crab - 3.5 oz has 71 mg.  Roasted chicken without skin, white meat - 4 oz has 66 mg.  Light bologna - 2 oz has 45 mg.  Deli-cut [place] - 2 oz has 31 mg.  Canned tuna - 3.5 oz has 31 mg.  Bacon - 1 oz has 29 mg.  Oysters and mussels (raw) - 3.5 oz has 25 mg.  Mackerel - 1 oz has 22 mg.  Trout - 1 oz has 20 mg.  Pork sausage - 1 link (1 oz) has 17 mg.  Salmon - 1 oz has 16 mg.  Tilapia - 1 oz has 14 mg. Dairy  Soft-serve ice cream -  cup (4 oz) has 103 mg.  Whole-milk yogurt - 1 cup (8 oz) has 29 mg.  Cheddar cheese - 1 oz has 28 mg.  American cheese - 1 oz has 28 mg.  Whole milk - 1 cup (8 oz) has 23 mg.  2% milk - 1 cup (8  oz) has 18 mg.  Cream cheese - 1 tablespoon (Tbsp) has 15 mg.  Cottage cheese -  cup (4 oz) has 14 mg.  Low-fat (1%) milk - 1 cup (8 oz) has 10 mg.  Sour cream - 1 Tbsp has 8.5 mg.  Low-fat yogurt - 1 cup (8 oz) has 8 mg.  Nonfat Greek yogurt - 1 cup (8 oz) has 7 mg.  Half-and-half cream - 1 Tbsp has 5 mg. Fats and oils  Cod liver oil - 1 tablespoon (Tbsp) has 82 mg.  Butter - 1 Tbsp has 15 mg.  Lard - 1 Tbsp has 14 mg.  Bacon grease - 1 Tbsp has 14 mg.  Mayonnaise - 1 Tbsp has 5-10 mg.  Margarine - 1 Tbsp has 3-10 mg. Exact amounts of cholesterol in these foods may vary depending on specific ingredients and brands. Foods without cholesterol Most plant-based foods do not have cholesterol unless you combine them with a food that has cholesterol. Foods without cholesterol include:  Grains and cereals.  Vegetables.  Fruits.  Vegetable oils, such as olive, canola, and sunflower oil.  Legumes, such as peas, beans, and lentils.  Nuts and seeds.  Egg whites. Summary  The body needs cholesterol in small amounts, but too much cholesterol can cause damage to the  arteries and heart.  Most people should eat less than 200 milligrams (mg) of cholesterol a day. This information is not intended to replace advice given to you by your health care provider. Make sure you discuss any questions you have with your health care provider. Document Released: 03/26/2017 Document Revised: 07/12/2017 Document Reviewed: 03/26/2017 Elsevier Patient Education  2020 [place].  Uterine Fibroids  Uterine fibroids (leiomyomas) are noncancerous (benign) tumors that can develop in the uterus. Fibroids may also develop in the fallopian tubes, cervix, or tissues (ligaments) near the uterus. You may have one or many fibroids. Fibroids vary in size, weight, and where they grow in the uterus. Some can become quite large. Most fibroids do not require medical treatment. What are the causes? The cause of this condition is not known. What increases the risk? You are more likely to develop this condition if you:  Are in your 30s or 40s and have not gone through menopause.  Have a family history of this condition.  Are of African-American descent.  Had your first period at an early age (early menarche).  Have not had any children (nulliparity).  Are overweight or obese. What are the signs or symptoms? Many women do not have any symptoms. Symptoms of this condition may include:  Heavy menstrual bleeding.  Bleeding or spotting between periods.  Pain and pressure in the pelvic area, between the hips.  Bladder problems, such as needing to urinate urgently or more often than usual.  Inability to have children (infertility).  Failure to carry pregnancy to term (miscarriage). How is this diagnosed? This condition may be diagnosed based on:  Your symptoms and medical history.  A physical exam.  A pelvic exam that includes feeling for any tumors.  Imaging tests, such as ultrasound or MRI. How is this treated? Treatment for this condition may include:  Seeing your  health care provider for follow-up visits to monitor your fibroids for any changes.  Taking NSAIDs such as ibuprofen, naproxen, or aspirin to reduce pain.  Hormone medicines. These may be taken as a pill, given in an injection, or delivered by a T-shaped device that is inserted into the uterus (intrauterine device, IUD).  Surgery to remove one of the following: ? The fibroids (myomectomy). Your health care provider may recommend this if fibroids affect your fertility and you want to become pregnant. ? The uterus (hysterectomy). ? Blood supply to the fibroids (uterine artery embolization). Follow these instructions at home:  Take over-the-counter and prescription medicines only as told by your health care provider.  Ask your health care provider if you should take iron pills or eat more iron-rich foods, such as dark green, leafy vegetables. Heavy menstrual bleeding can cause low iron levels.  If directed, apply heat to your back or abdomen to reduce pain. Use the heat source that your health care provider recommends, such as a moist heat pack or a heating pad. ? Place a towel between your skin and the heat source. ? Leave the heat on for 20-30 minutes. ? Remove the heat if your skin turns bright red. This is especially important if you are unable to feel pain, heat, or cold. You may have a greater risk of getting burned.  Pay close attention to your menstrual cycle. Tell your health care provider about any changes, such as: ? Increased blood flow that requires you to use more pads or tampons than usual. ? A change in the number of days that your period lasts. ? A change in symptoms that are associated with your period, such as back pain or cramps in your abdomen.  Keep all follow-up visits as told by your health care provider. This is important, especially if your fibroids need to be monitored for any changes. Contact a health care provider if you:  Have pelvic pain, back pain, or cramps  in your abdomen that do not get better with medicine or heat.  Develop new bleeding between periods.  Have increased bleeding during or between periods.  Feel unusually tired or weak.  Feel light-headed. Get help right away if you:  Faint.  Have pelvic pain that suddenly gets worse.  Have severe vaginal bleeding that soaks a tampon or pad in 30 minutes or less. Summary  Uterine fibroids are noncancerous (benign) tumors that can develop in the uterus.  The exact cause of this condition is not known.  Most fibroids do not require medical treatment unless they affect your ability to have children (fertility).  Contact a health care provider if you have pelvic pain, back pain, or cramps in your abdomen that do not get better with medicines.  Make sure you know what symptoms should cause you to get help right away. This information is not intended to replace advice given to you by your health care provider. Make sure you discuss any questions you have with your health care provider. Document Released: 07/27/2000 Document Revised: 07/12/2017 Document Reviewed: 06/25/2017 Elsevier Patient Education  2020 [employer].  Myomectomy  Myomectomy is a surgery in which a non-cancerous fibroid (myoma) is removed from the uterus. Myomas are tumors made up of fibrous tissue. They are often called fibroid tumors. Fibroid tumors can range from the size of a pea to the size of a grapefruit. In a myomectomy, the fibroid tumor is removed without removing the uterus. Because these tumors are rarely cancerous, this surgery is usually done only if the tumor is growing or causing symptoms such as pain, pressure, bleeding, or pain with intercourse. Tell a health care provider about:  Any allergies you have.  All medicines you are taking, including vitamins, herbs, eye drops, creams, and over-the-counter medicines.  Any problems you or family members have  had with anesthetic medicines.  Any blood  disorders you have.  Any surgeries you have had.  Any medical conditions you have. What are the risks? Generally, this is a safe procedure. However, problems may occur, including:  Bleeding.  Infection.  Allergic reactions to medicines.  Damage to other structures or organs.  Blood clots in the legs, chest, or brain.  Scar tissue on other organs and in the pelvis. This may require another surgery to remove the scar tissue. What happens before the procedure? Staying hydrated Follow instructions from your health care provider about hydration, which may include:  Up to 2 hours before the procedure - you may continue to drink clear liquids, such as water, clear fruit juice, black coffee, and plain tea. Eating and drinking restrictions Follow instructions from your health care provider about eating and drinking, which may include:  8 hours before the procedure - stop eating heavy meals or foods such as meat, fried foods, or fatty foods.  6 hours before the procedure - stop eating light meals or foods, such as toast or cereal.  6 hours before the procedure - stop drinking milk or drinks that contain milk.  2 hours before the procedure - stop drinking clear liquids. General instructions  Ask your health care provider about: ? Changing or stopping your regular medicines. This is especially important if you are taking diabetes medicines or blood thinners. ? Taking medicines such as aspirin and ibuprofen. These medicines can thin your blood. Do not take these medicines before your procedure if your health care provider instructs you not to.  Do not drink alcohol the day before the surgery.  Do not use any products that contain nicotine or tobacco, such as cigarettes and e-cigarettes, for 2 weeks before the procedure. If you need help quitting, ask your health care provider.  Plan to have someone take you home from the hospital or clinic. Also arrange for someone to help you with  activities during your recovery. What happens during the procedure?  To reduce your risk of infection: ? Your health care team will wash or sanitize their hands. ? Your skin will be washed with soap. ? Hair may be removed from the surgical area.  An IV tube will be inserted into one of your veins. Medicines will be able to flow directly into your body through this IV tube.  You will be given one or more of the following: ? A medicine to help you relax (sedative). ? A medicine to make you fall asleep (general anesthetic).  Small monitors will be attached to your body. They will be used to check your heart, blood pressure, and oxygen level.  A breathing tube will be placed into your lungs during the procedure.  A thin, flexible tube (catheter) will be inserted into your bladder to collect urine.  Your surgeon will use one of the following methods to perform the procedure. The method used will depend on the size, shape, location, and number of fibroids. Hysteroscopic myomectomy This method may be used when the fibroid tumor is inside the cavity of the uterus. A long, thin tube with a lens (hysteroscope) will be inserted into the uterus through the vagina. A saline solution will be put into the uterus. This will expand the uterus and allow the surgeon to see the fibroids. Tools will be passed through the hysteroscope to remove the fibroid tumor in pieces. Laparoscopic myomectomy A few small incisions will be made in the lower abdomen. A thin, lighted  tube with a camera (laparoscope) will be inserted through one of the incisions. This will give the surgeon a good view of the area. The fibroid tumor will be removed through the other incisions. The incisions will then be closed with stitches (sutures) or staples. Abdominal myomectomy This method is used when the fibroid tumor cannot be removed with a hysteroscope or laparoscope. The surgery will be done through a larger surgical incision in the  abdomen. The fibroid tumor will be removed through this incision. The incision will be closed with sutures or staples. Recovery time will be longer if this method is used. The procedures may vary among health care providers and hospitals. What happens after the procedure?  Your blood pressure, heart rate, breathing rate, and blood oxygen level will be monitored until the medicines you were given have worn off.  The IV access tube and catheter will remain on your body for a period of time.  You may be given medicine for pain or to help you sleep.  You may be given an antibiotic medicine if needed.  Do not drive for 24 hours if you were given a sedative. Summary  Myomectomy is surgery to remove a noncancerous fibroid (myoma) from the uterus.  This surgery is usually done only if the tumor is growing or causing symptoms such as pain, pressure, bleeding, or pain during intercourse.  Follow instructions from your health care provider about eating and drinking before the procedure.  Recovery time from this procedure depends on the method. The abdominal method will require a longer recovery time. This information is not intended to replace advice given to you by your health care provider. Make sure you discuss any questions you have with your health care provider. Document Released: 05/27/2007 Document Revised: 11/21/2018 Document Reviewed: 08/30/2016 Elsevier Patient Education  2020 [place] After This sheet gives you information about how to care for yourself after your procedure. Your health care provider may also give you more specific instructions. If you have problems or questions, contact your health care provider. What can I expect after the procedure? After the procedure, it is common to have:  Pain in your abdomen, especially at the incision areas. You will be given pain medicine to control the pain.  Tiredness. This is a normal part of the recovery  process. Your energy level will return to normal over the coming weeks.  Vaginal bleeding. This is normal and will stop in the coming weeks.  Constipation. Recovery time from this procedure will depend on the type of procedure you had and your general overall health prior to the procedure. Follow these instructions at home: Medicines  Take over-the-counter and prescription medicines only as told by your health care provider.  Do not take aspirin because it can cause bleeding.  If you were prescribed an antibiotic medicine, use it as told by your health care provider. Do not stop using the antibiotic even if you start to feel better.  Do not drive or use heavy machinery while taking prescription pain medicine.  Do not drink alcohol while taking prescription pain medicine. Incision care   Follow instructions from your health care provider about how to take care of any incisions. Make sure you: ? Wash your hands with soap and water before you change your bandage (dressing). If soap and water are not available, use hand sanitizer. ? Change your dressing as told by your health care provider. ? Leave stitches (sutures), skin glue, or adhesive strips in  place. These skin closures may need to stay in place for 2 weeks or longer. If adhesive strip edges start to loosen and curl up, you may trim the loose edges. Do not remove adhesive strips completely unless your health care provider tells you to do that.  Check your incision areas every day for signs of infection. Check for: ? Redness, swelling, or pain. ? Fluid or blood. ? Warmth. ? Pus or a bad smell.  Do not take baths, swim, or use a hot tub until your health care provider approves. Take showers as directed by your health care provider. Activity  Return to your normal activities as told by your health care provider. Ask your health care provider what activities are safe for you.  Do not do activities that require a lot of effort  until your health care provider says it is okay.  Do not lift anything that is heavier than 15 lb (6.8 kg) until your health care provider says that it is safe.  Do not douche, use tampons, or have sexual intercourse until your health care provider approves.  Walk daily but take frequent rest breaks if you tire easily.  Continue to practice deep breathing and coughing. If it hurts to cough, try holding a pillow against your belly as you cough.  Do not drive until your health care provider approves. General instructions  To prevent or treat constipation while you are taking prescription pain medicine, your health care provider may recommend that you: ? Drink enough fluid to keep your urine clear or pale yellow. ? Take over-the-counter or prescription medicines. ? Eat foods that are high in fiber, such as fresh fruits and vegetables, whole grains, and beans. ? Limit foods that are high in fat and processed sugars, such as fried and sweet foods.  Take your temperature twice a day and write it down. If you develop a fever, this may be a sign that you have an infection.  Do not drink alcohol.  Have someone help you at home for 1 week or until you can do your own household activities.  Keep all follow-up visits as told by your health care provider. This is important. Contact a health care provider if:  You have a fever.  You have increasing abdominal pain that is not relieved with medicine.  You have nausea, vomiting, or diarrhea.  You have pain when you urinate or you have blood in your urine.  You have a rash on your body.  You have pain or redness where your IV access tube was inserted.  You have redness, swelling, or pain around an incision.  You have fluid or blood coming from an incision.  An incision feels warm to the touch.  You have pus or a bad smell coming from an incision. Get help right away if:  You have weakness or light-headedness.  You have pain,  swelling, or redness in your legs.  You have chest pain.  You faint.  You have shortness of breath.  You have heavy vaginal bleeding.  You have an incision that is opening up. Summary  Recovery time from this procedure will depend on the type of procedure you had and your general overall health prior to the procedure.  If you were prescribed an antibiotic medicine, use it as told by your health care provider. Do not stop using the antibiotic even if you start to feel better.  Do not douche, use tampons, or have sexual intercourse until your health care provider  approves.  Return to your normal activities as told by your health care provider. Ask your health care provider what activities are safe for you. This information is not intended to replace advice given to you by your health care provider. Make sure you discuss any questions you have with your health care provider. Document Released: 12/20/2010 Document Revised: 07/12/2017 Document Reviewed: 08/30/2016 Elsevier Patient Education  2020 [employer].

## 2019-07-17 DIAGNOSIS — M7742 Metatarsalgia, left foot: Secondary | ICD-10-CM | POA: Diagnosis not present

## 2019-07-29 ENCOUNTER — Encounter: Payer: Self-pay | Admitting: Internal Medicine

## 2019-08-25 DIAGNOSIS — Z09 Encounter for follow-up examination after completed treatment for conditions other than malignant neoplasm: Secondary | ICD-10-CM | POA: Diagnosis not present

## 2019-08-25 DIAGNOSIS — Z319 Encounter for procreative management, unspecified: Secondary | ICD-10-CM | POA: Diagnosis not present

## 2019-08-29 DIAGNOSIS — Z01818 Encounter for other preprocedural examination: Secondary | ICD-10-CM | POA: Diagnosis not present

## 2019-09-01 DIAGNOSIS — Z793 Long term (current) use of hormonal contraceptives: Secondary | ICD-10-CM | POA: Diagnosis not present

## 2019-09-01 DIAGNOSIS — D250 Submucous leiomyoma of uterus: Secondary | ICD-10-CM | POA: Diagnosis not present

## 2019-09-01 DIAGNOSIS — N921 Excessive and frequent menstruation with irregular cycle: Secondary | ICD-10-CM | POA: Diagnosis not present

## 2019-09-01 DIAGNOSIS — Z79899 Other long term (current) drug therapy: Secondary | ICD-10-CM | POA: Diagnosis not present

## 2019-09-01 DIAGNOSIS — D259 Leiomyoma of uterus, unspecified: Secondary | ICD-10-CM | POA: Diagnosis not present

## 2019-09-04 DIAGNOSIS — N920 Excessive and frequent menstruation with regular cycle: Secondary | ICD-10-CM | POA: Diagnosis not present

## 2019-10-22 DIAGNOSIS — H903 Sensorineural hearing loss, bilateral: Secondary | ICD-10-CM | POA: Insufficient documentation

## 2020-01-14 ENCOUNTER — Encounter: Payer: Self-pay | Admitting: Internal Medicine

## 2020-01-14 ENCOUNTER — Telehealth (INDEPENDENT_AMBULATORY_CARE_PROVIDER_SITE_OTHER): Payer: BC Managed Care – PPO | Admitting: Internal Medicine

## 2020-01-14 VITALS — Ht 64.0 in | Wt 165.0 lb

## 2020-01-14 DIAGNOSIS — Z1329 Encounter for screening for other suspected endocrine disorder: Secondary | ICD-10-CM

## 2020-01-14 DIAGNOSIS — Z13220 Encounter for screening for lipoid disorders: Secondary | ICD-10-CM | POA: Diagnosis not present

## 2020-01-14 DIAGNOSIS — E785 Hyperlipidemia, unspecified: Secondary | ICD-10-CM

## 2020-01-14 DIAGNOSIS — F411 Generalized anxiety disorder: Secondary | ICD-10-CM

## 2020-01-14 DIAGNOSIS — Z1283 Encounter for screening for malignant neoplasm of skin: Secondary | ICD-10-CM

## 2020-01-14 DIAGNOSIS — F410 Panic disorder [episodic paroxysmal anxiety] without agoraphobia: Secondary | ICD-10-CM | POA: Diagnosis not present

## 2020-01-14 DIAGNOSIS — L989 Disorder of the skin and subcutaneous tissue, unspecified: Secondary | ICD-10-CM

## 2020-01-14 DIAGNOSIS — Z0000 Encounter for general adult medical examination without abnormal findings: Secondary | ICD-10-CM | POA: Diagnosis not present

## 2020-01-14 DIAGNOSIS — Z1389 Encounter for screening for other disorder: Secondary | ICD-10-CM

## 2020-01-14 DIAGNOSIS — E559 Vitamin D deficiency, unspecified: Secondary | ICD-10-CM

## 2020-01-14 MED ORDER — DIAZEPAM 5 MG PO TABS: 5.0000 mg | ORAL_TABLET | Freq: Every day | ORAL | 0 refills | Status: DC | PRN

## 2020-01-14 NOTE — Progress Notes (Signed)
telephone Note  I connected with Nichole Cain  on 01/14/20 at  9:15 AM EDT by telephone and verified that I am speaking with the correct person using two identifiers.  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 The patient expressed understanding and agreed to proceed.   HPI: 1. GAD/panic attacks since fibroid surgery w/in the last few months with chest tightness, feelings of doom given valium by surgeon which helped xanax did not help in the past  Denies depression has seen therapy in the past  2. S/p myomectomy for fibroids and have nausea after but doing better  Having vaginal spotting after surgery and needs to f/u with ob/gyn disc [facility] Dr. Lanna Poche and [facility] ob/gyn Dr. Benjie Karvonen to establish in future  ROS: See pertinent positives and negatives per HPI.  Past Medical History:  Diagnosis Date  . Allergy   . Anxiety   . Hearing loss    wears hearing aids b/l   . Sinusitis    right maxillary    Past Surgical History:  Procedure Laterality Date  . INNER EAR SURGERY     tubes x 9 and skin grafts in ear drums  . MYOMECTOMY      Family History  Problem Relation Age of Onset  . Anxiety disorder Mother   . Depression Mother   . Heart disease Mother        MI  . Hyperlipidemia Mother   . Hypertension Mother   . Heart disease Maternal Grandfather        CHF  . Cancer Paternal Grandfather        testicular cancer    SOCIAL HX: lives alone   Current Outpatient Medications:  .  cetirizine (ZYRTEC) 10 MG tablet, Take 10 mg by mouth daily as needed for allergies., Disp: , Rfl:  .  fluticasone (FLONASE) 50 MCG/ACT nasal spray, Place into both nostrils daily., Disp: , Rfl:  .  norethindrone (MICRONOR) 0.35 MG tablet, TAKE 1 TABLET BY MOUTH EVERY DAY, Disp: , Rfl:  .   diazepam (VALIUM) 5 MG tablet, Take 1 tablet (5 mg total) by mouth daily as needed for anxiety., Disp: 20 tablet, Rfl: 0 .  montelukast (SINGULAIR) 10 MG tablet, Take 1 tablet (10 mg total) by mouth at bedtime. (Patient not taking: Reported on 01/14/2020), Disp: 90 tablet, Rfl: 3  EXAM:  VITALS per patient if applicable:  GENERAL: alert, oriented, appears well and in no acute distress  PSYCH/NEURO: pleasant and cooperative, no obvious depression or anxiety, speech and thought processing grossly intact  ASSESSMENT AND PLAN:  Discussed the following assessment and plan:  GAD (generalized anxiety disorder) - Plan: diazepam (VALIUM) 5 MG tablet  Panic attack - Plan: diazepam (VALIUM) 5 MG tablet Consider therapy declines SSRI daily meds for anxiety   HM flu shot none in system for 2020  Tdap had in 2015 in [place] per pt  Declines covid 19 vx Declines STD check   Had labs 03/13/19 and 06/11/19 [facility] ob[facility]  Pap 03/05/19 HPV neg and ASCUS + f/u in 1 year  Seeing [facility] in [place] Dr. Christene Lye for myomectomy 08/2019 [facility] for labs per pt had vit D there and other labs 07/14/2019  -vitamin D 25.5 07/14/2019 TSH 0.683  CBC 5.3/11.8/36.9/306  Had lipid 03/13/2019  cmet 03/13/19  Will need UA in future  IUD out as of 02/2019 [facility] ob/gyn  Dermatology consider in future as of 01/14/20 no need for this rec healthy diet and exercise  HLD total cholesterol 206 03/13/2019 rec healthy diet and exercise  -we discussed possible serious and likely etiologies, options for evaluation and workup, limitations of telemedicine visit vs in person visit, treatment, treatment risks and precautions. Pt prefers to treat via telemedicine empirically rather then risking or undertaking an in person visit at this moment. Patient agrees to seek prompt in person care if worsening, new symptoms arise, or if is not improving with treatment.   I discussed the assessment and treatment plan with the patient. The patient was provided an  opportunity to ask questions and all were answered. The patient agreed with the plan and demonstrated an understanding of the instructions.   The patient was advised to call back or seek an in-person evaluation if the symptoms worsen or if the condition fails to improve as anticipated.  Time 20 min Delorise Jackson, MD

## 2020-01-14 NOTE — Patient Instructions (Signed)
Stress relax brand L theanine or Tranquil sleep for stress and sleep issues (amazon or whole foods)   apps calm, headspace, replika, bloom, insight timer  Mindfulness-Based Stress Reduction Mindfulness-based stress reduction (MBSR) is a program that helps people learn to practice mindfulness. Mindfulness is the practice of intentionally paying attention to the present moment. It can be learned and practiced through techniques such as education, breathing exercises, meditation, and yoga. MBSR includes several mindfulness techniques in one program. MBSR works best when you understand the treatment, are willing to try new things, and can commit to spending time practicing what you learn. MBSR training may include learning about:  How your emotions, thoughts, and reactions affect your body.  New ways to respond to things that cause negative thoughts to start (triggers).  How to notice your thoughts and let go of them.  Practicing awareness of everyday things that you normally do without thinking.  The techniques and goals of different types of meditation. What are the benefits of MBSR? MBSR can have many benefits, which include helping you to:  Develop self-awareness. This refers to knowing and understanding yourself.  Learn skills and attitudes that help you to participate in your own health care.  Learn new ways to care for yourself.  Be more accepting about how things are, and let things go.  Be less judgmental and approach things with an open mind.  Be patient with yourself and trust yourself more. MBSR has also been shown to:  Reduce negative emotions, such as depression and anxiety.  Improve memory and focus.  Change how you sense and approach pain.  Boost your body's ability to fight infections.  Help you connect better with other people.  Improve your sense of well-being. Follow these instructions at home:   Find a local in-person or online MBSR program.  Set  aside some time regularly for mindfulness practice.  Find a mindfulness practice that works best for you. This may include one or more of the following: ? Meditation. Meditation involves focusing your mind on a certain thought or activity. ? Breathing awareness exercises. These help you to stay present by focusing on your breath. ? Body scan. For this practice, you lie down and pay attention to each part of your body from head to toe. You can identify tension and soreness and intentionally relax parts of your body. ? Yoga. Yoga involves stretching and breathing, and it can improve your ability to move and be flexible. It can also provide an experience of testing your body's limits, which can help you release stress. ? Mindful eating. This way of eating involves focusing on the taste, texture, color, and smell of each bite of food. Because this slows down eating and helps you feel full sooner, it can be an important part of a weight-loss plan.  Find a podcast or recording that provides guidance for breathing awareness, body scan, or meditation exercises. You can listen to these any time when you have a free moment to rest without distractions.  Follow your treatment plan as told by your health care provider. This may include taking regular medicines and making changes to your diet or lifestyle as recommended. How to practice mindfulness To do a basic awareness exercise:  Find a comfortable place to sit.  Pay attention to the present moment. Observe your thoughts, feelings, and surroundings just as they are.  Avoid placing judgment on yourself, your feelings, or your surroundings. Make note of any judgment that comes up, and let it  go.  Your mind may wander, and that is okay. Make note of when your thoughts drift, and return your attention to the present moment. To do basic mindfulness meditation:  Find a comfortable place to sit. This may include a stable chair or a firm floor cushion. ? Sit  upright with your back straight. Let your arms fall next to your side with your hands resting on your legs. ? If sitting in a chair, rest your feet flat on the floor. ? If sitting on a cushion, cross your legs in front of you.  Keep your head in a neutral position with your chin dropped slightly. Relax your jaw and rest the tip of your tongue on the roof of your mouth. Drop your gaze to the floor. You can close your eyes if you like.  Breathe normally and pay attention to your breath. Feel the air moving in and out of your nose. Feel your belly expanding and relaxing with each breath.  Your mind may wander, and that is okay. Make note of when your thoughts drift, and return your attention to your breath.  Avoid placing judgment on yourself, your feelings, or your surroundings. Make note of any judgment or feelings that come up, let them go, and bring your attention back to your breath.  When you are ready, lift your gaze or open your eyes. Pay attention to how your body feels after the meditation. Where to find more information You can find more information about MBSR from:  Your health care provider.  Community-based meditation centers or programs.  Programs offered near you. Summary  Mindfulness-based stress reduction (MBSR) is a program that teaches you how to intentionally pay attention to the present moment. It is used with other treatments to help you cope better with daily stress, emotions, and pain.  MBSR focuses on developing self-awareness, which allows you to respond to life stress without judgment or negative emotions.  MBSR programs may involve learning different mindfulness practices, such as breathing exercises, meditation, yoga, body scan, or mindful eating. Find a mindfulness practice that works best for you, and set aside time for it on a regular basis. This information is not intended to replace advice given to you by your health care provider. Make sure you discuss any  questions you have with your health care provider. Document Revised: 07/12/2017 Document Reviewed: 12/06/2016 Elsevier Patient Education  [place].  Generalized Anxiety Disorder, Adult Generalized anxiety disorder (GAD) is a mental health disorder. People with this condition constantly worry about everyday events. Unlike normal anxiety, worry related to GAD is not triggered by a specific event. These worries also do not fade or get better with time. GAD interferes with life functions, including relationships, work, and school. GAD can vary from mild to severe. People with severe GAD can have intense waves of anxiety with physical symptoms (panic attacks). What are the causes? The exact cause of GAD is not known. What increases the risk? This condition is more likely to develop in:  Women.  People who have a family history of anxiety disorders.  People who are very shy.  People who experience very stressful life events, such as the death of a loved one.  People who have a very stressful family environment. What are the signs or symptoms? People with GAD often worry excessively about many things in their lives, such as their health and family. They may also be overly concerned about:  Doing well at work.  Being on time.  Natural disasters.  Friendships. Physical symptoms of GAD include:  Fatigue.  Muscle tension or having muscle twitches.  Trembling or feeling shaky.  Being easily startled.  Feeling like your heart is pounding or racing.  Feeling out of breath or like you cannot take a deep breath.  Having trouble falling asleep or staying asleep.  Sweating.  Nausea, diarrhea, or irritable bowel syndrome (IBS).  Headaches.  Trouble concentrating or remembering facts.  Restlessness.  Irritability. How is this diagnosed? Your health care provider can diagnose GAD based on your symptoms and medical history. You will also have a physical exam. The health  care provider will ask specific questions about your symptoms, including how severe they are, when they started, and if they come and go. Your health care provider may ask you about your use of alcohol or drugs, including prescription medicines. Your health care provider may refer you to a mental health specialist for further evaluation. Your health care provider will do a thorough examination and may perform additional tests to rule out other possible causes of your symptoms. To be diagnosed with GAD, a person must have anxiety that:  Is out of his or her control.  Affects several different aspects of his or her life, such as work and relationships.  Causes distress that makes him or her unable to take part in normal activities.  Includes at least three physical symptoms of GAD, such as restlessness, fatigue, trouble concentrating, irritability, muscle tension, or sleep problems. Before your health care provider can confirm a diagnosis of GAD, these symptoms must be present more days than they are not, and they must last for six months or longer. How is this treated? The following therapies are usually used to treat GAD:  Medicine. Antidepressant medicine is usually prescribed for long-term daily control. Antianxiety medicines may be added in severe cases, especially when panic attacks occur.  Talk therapy (psychotherapy). Certain types of talk therapy can be helpful in treating GAD by providing support, education, and guidance. Options include: ? Cognitive behavioral therapy (CBT). People learn coping skills and techniques to ease their anxiety. They learn to identify unrealistic or negative thoughts and behaviors and to replace them with positive ones. ? Acceptance and commitment therapy (ACT). This treatment teaches people how to be mindful as a way to cope with unwanted thoughts and feelings. ? Biofeedback. This process trains you to manage your body's response (physiological response)  through breathing techniques and relaxation methods. You will work with a therapist while machines are used to monitor your physical symptoms.  Stress management techniques. These include yoga, meditation, and exercise. A mental health specialist can help determine which treatment is best for you. Some people see improvement with one type of therapy. However, other people require a combination of therapies. Follow these instructions at home:  Take over-the-counter and prescription medicines only as told by your health care provider.  Try to maintain a normal routine.  Try to anticipate stressful situations and allow extra time to manage them.  Practice any stress management or self-calming techniques as taught by your health care provider.  Do not punish yourself for setbacks or for not making progress.  Try to recognize your accomplishments, even if they are small.  Keep all follow-up visits as told by your health care provider. This is important. Contact a health care provider if:  Your symptoms do not get better.  Your symptoms get worse.  You have signs of depression, such as: ? A persistently sad, cranky,  or irritable mood. ? Loss of enjoyment in activities that used to bring you joy. ? Change in weight or eating. ? Changes in sleeping habits. ? Avoiding friends or family members. ? Loss of energy for normal tasks. ? Feelings of guilt or worthlessness. Get help right away if:  You have serious thoughts about hurting yourself or others. If you ever feel like you may hurt yourself or others, or have thoughts about taking your own life, get help right away. You can go to your nearest emergency department or call:  Your local emergency services (911 in the [place]  A suicide crisis helpline, such as the [place] at 5595652630. This is open 24 hours a day. Summary  Generalized anxiety disorder (GAD) is a mental health disorder that involves  worry that is not triggered by a specific event.  People with GAD often worry excessively about many things in their lives, such as their health and family.  GAD may cause physical symptoms such as restlessness, trouble concentrating, sleep problems, frequent sweating, nausea, diarrhea, headaches, and trembling or muscle twitching.  A mental health specialist can help determine which treatment is best for you. Some people see improvement with one type of therapy. However, other people require a combination of therapies. This information is not intended to replace advice given to you by your health care provider. Make sure you discuss any questions you have with your health care provider. Document Revised: 07/12/2017 Document Reviewed: 06/19/2016 Elsevier Patient Education  2020 [employer].

## 2020-01-20 ENCOUNTER — Telehealth: Payer: Self-pay | Admitting: Internal Medicine

## 2020-01-20 ENCOUNTER — Other Ambulatory Visit: Payer: Self-pay | Admitting: Internal Medicine

## 2020-01-20 DIAGNOSIS — F410 Panic disorder [episodic paroxysmal anxiety] without agoraphobia: Secondary | ICD-10-CM

## 2020-01-20 DIAGNOSIS — F411 Generalized anxiety disorder: Secondary | ICD-10-CM

## 2020-01-20 MED ORDER — DIAZEPAM 5 MG PO TABS: 5.0000 mg | ORAL_TABLET | Freq: Every day | ORAL | 0 refills | Status: DC | PRN

## 2020-01-20 NOTE — Telephone Encounter (Signed)
Patient was seen virtually on 01/14/20. Confirmed with the pharmacy that they never got the medication. Says that the script was printed.   Please advise

## 2020-01-20 NOTE — Telephone Encounter (Signed)
Why is out of valium I gave her 20 tablets of valium 5 mg on 01/14/20  If she is taking this many I rec she see psychiatry for management of her anxiety/panic  Agreeable to referral?  Call pt and pharmacy

## 2020-01-20 NOTE — Telephone Encounter (Signed)
Medication re ordered and will not go through electronically.   Called into the pharmacy. Verbally called in Valium 5 mg, one daily prn, 20 tablets no refills. Printed copy has been sent to [facility].

## 2020-01-20 NOTE — Telephone Encounter (Signed)
Pt states that pharmacy tells her that they do not have rx for diazepam (VALIUM) 5 MG tablet even though she can see on MyChart that it was sent over. Pt is having bad panic attacks and would like this called in asap please.

## 2020-01-20 NOTE — Telephone Encounter (Signed)
Patient was seen virtually on 01/14/20. Confirmed with the pharmacy that they never got the medication. Says that the script was printed.   Please advise     Please call in valium 5 mg qd prn #20 Rx will not go through Does pt want to see psychiatry for anxiety/panic  [place]

## 2020-01-21 NOTE — Telephone Encounter (Signed)
See other phone encounter from 06/09

## 2020-02-21 ENCOUNTER — Other Ambulatory Visit: Payer: Self-pay

## 2020-02-21 ENCOUNTER — Emergency Department
Admission: EM | Admit: 2020-02-21 | Discharge: 2020-02-21 | Disposition: A | Discharge status: Home / Self Care | Payer: BC Managed Care – PPO | Attending: Emergency Medicine | Admitting: Emergency Medicine

## 2020-02-21 ENCOUNTER — Emergency Department: Payer: BC Managed Care – PPO

## 2020-02-21 DIAGNOSIS — S61452A Open bite of left hand, initial encounter: Secondary | ICD-10-CM | POA: Diagnosis not present

## 2020-02-21 DIAGNOSIS — Z79899 Other long term (current) drug therapy: Secondary | ICD-10-CM | POA: Diagnosis not present

## 2020-02-21 DIAGNOSIS — Y929 Unspecified place or not applicable: Secondary | ICD-10-CM | POA: Insufficient documentation

## 2020-02-21 DIAGNOSIS — F17200 Nicotine dependence, unspecified, uncomplicated: Secondary | ICD-10-CM | POA: Insufficient documentation

## 2020-02-21 DIAGNOSIS — W540XXA Bitten by dog, initial encounter: Secondary | ICD-10-CM | POA: Diagnosis not present

## 2020-02-21 DIAGNOSIS — S61217A Laceration without foreign body of left little finger without damage to nail, initial encounter: Secondary | ICD-10-CM | POA: Diagnosis not present

## 2020-02-21 DIAGNOSIS — Z23 Encounter for immunization: Secondary | ICD-10-CM | POA: Insufficient documentation

## 2020-02-21 DIAGNOSIS — Y939 Activity, unspecified: Secondary | ICD-10-CM | POA: Insufficient documentation

## 2020-02-21 DIAGNOSIS — S60477A Other superficial bite of left little finger, initial encounter: Secondary | ICD-10-CM | POA: Insufficient documentation

## 2020-02-21 DIAGNOSIS — Y999 Unspecified external cause status: Secondary | ICD-10-CM | POA: Insufficient documentation

## 2020-02-21 MED ORDER — LIDOCAINE HCL (PF) 1 % IJ SOLN
5.0000 mL | Freq: Once | INTRAMUSCULAR | Status: AC
  Administered 2020-02-21: 5 mL
  Filled 2020-02-21: qty 5

## 2020-02-21 MED ORDER — AMOXICILLIN-POT CLAVULANATE 875-125 MG PO TABS
1.0000 | ORAL_TABLET | Freq: Once | ORAL | Status: AC
  Administered 2020-02-21: 1 via ORAL
  Filled 2020-02-21: qty 1

## 2020-02-21 MED ORDER — HYDROCODONE-ACETAMINOPHEN 5-325 MG PO TABS: 1.0000 | ORAL_TABLET | Freq: Three times a day (TID) | ORAL | 0 refills | Status: AC | PRN

## 2020-02-21 MED ORDER — TETANUS-DIPHTH-ACELL PERTUSSIS 5-2.5-18.5 LF-MCG/0.5 IM SUSP
0.5000 mL | Freq: Once | INTRAMUSCULAR | Status: AC
  Administered 2020-02-21: 0.5 mL via INTRAMUSCULAR
  Filled 2020-02-21: qty 0.5

## 2020-02-21 MED ORDER — HYDROCODONE-ACETAMINOPHEN 5-325 MG PO TABS
1.0000 | ORAL_TABLET | Freq: Once | ORAL | Status: AC
  Administered 2020-02-21: 1 via ORAL
  Filled 2020-02-21: qty 1

## 2020-02-21 MED ORDER — AMOXICILLIN-POT CLAVULANATE 875-125 MG PO TABS: 1.0000 | ORAL_TABLET | Freq: Two times a day (BID) | ORAL | 0 refills | Status: AC

## 2020-02-21 NOTE — ED Provider Notes (Signed)
[facility] Emergency Department Provider Note ____________________________________________  Time seen: 1932  I have reviewed the triage vital signs and the nursing notes.  HISTORY  Chief Complaint  Animal Bite  HPI Nichole Cain is a 34 y.o. female presents to the ED for evaluation of an accidental dog bite to the left hand.  Patient describes she sustained a bite for the left pinky finger after she was attempting to separate a large dog who had attacked her small dachshund.  She reached her hand and sustained a dog bite to the left pinky from the larger dog.  Patient is in contact with the dog's owner to determine rabies status at this time.  Patient denies any other injury.   She reports the dog was known to her from her apartment complex, and reports that the dog did not appear ill, unkempt, or otherwise rabid.   Past Medical History:  Diagnosis Date  . Allergy   . Anxiety   . Hearing loss    wears hearing aids b/l   . Sinusitis    right maxillary    Patient Active Problem List   Diagnosis Date Noted  . GAD (generalized anxiety disorder) 01/14/2020  . Panic attack 01/14/2020  . Fibroids 07/16/2019  . Annual physical exam 07/16/2019  . Hyperlipidemia 07/16/2019  . Bilateral hearing loss 01/14/2019  . Allergic rhinitis 01/14/2019  . Menses painful 01/14/2019    Past Surgical History:  Procedure Laterality Date  . INNER EAR SURGERY     tubes x 9 and skin grafts in ear drums  . MYOMECTOMY      Prior to Admission medications   Medication Sig Start Date End Date Taking? Authorizing Provider  amoxicillin-clavulanate (AUGMENTIN) 875-125 MG tablet Take 1 tablet by mouth 2 (two) times daily for 10 days. 02/21/20 03/02/20  Debbrah Sampedro, Dannielle Karvonen, PA-C  cetirizine (ZYRTEC) 10 MG tablet Take 10 mg by mouth daily as needed for allergies.    [provider]  diazepam (VALIUM) 5 MG tablet Take 1 tablet (5 mg total) by mouth daily as needed for  anxiety. 01/20/20   McLean-Scocuzza, Nino Glow, MD  fluticasone (FLONASE) 50 MCG/ACT nasal spray Place into both nostrils daily.    [provider]  HYDROcodone-acetaminophen (NORCO) 5-325 MG tablet Take 1 tablet by mouth 3 (three) times daily as needed for up to 2 days. 02/21/20 02/23/20  Lantz Hermann, Dannielle Karvonen, PA-C  montelukast (SINGULAIR) 10 MG tablet Take 1 tablet (10 mg total) by mouth at bedtime. Patient not taking: Reported on 01/14/2020 01/14/19   McLean-Scocuzza, Nino Glow, MD  norethindrone (MICRONOR) 0.35 MG tablet TAKE 1 TABLET BY MOUTH EVERY DAY 09/11/19   [provider]    Allergies Patient has no known allergies.  Family History  Problem Relation Age of Onset  . Anxiety disorder Mother   . Depression Mother   . Heart disease Mother        MI  . Hyperlipidemia Mother   . Hypertension Mother   . Heart disease Maternal Grandfather        CHF  . Cancer Paternal Grandfather        testicular cancer    Social History Social History   Tobacco Use  . Smoking status: Current Some Day Smoker  . Smokeless tobacco: Never Used  . Tobacco comment: smoker since 57s to 2017 10+ years max 1 pk lasting 2 days to 1 ppd   Substance Use Topics  . Alcohol use: Yes  Comment: occasionally   . Drug use: Not Currently    Review of Systems  Constitutional: Negative for fever. Cardiovascular: Negative for chest pain. Respiratory: Negative for shortness of breath. Musculoskeletal: Negative for back pain. Skin: Negative for rash. Dog bite left hand Neurological: Negative for headaches, focal weakness or numbness. ____________________________________________  PHYSICAL EXAM:  VITAL SIGNS: ED Triage Vitals [02/21/20 1910]  Enc Vitals Group     BP 121/68     Pulse Rate 100     Resp 19     Temp 99.5 F (37.5 C)     Temp src      SpO2 99 %     Weight 164 lb 14.5 oz (74.8 kg)     Height 5\' 4"  (1.626 m)     Head Circumference      Peak Flow      Pain Score 10      Pain Loc      Pain Edu?      Excl. in [place]?     Constitutional: Alert and oriented. Well appearing and in no distress. Head: Normocephalic and atraumatic. Eyes: Conjunctivae are normal. Normal extraocular movements Cardiovascular: Normal rate, regular rhythm. Normal distal pulses and cap refill. Respiratory: Normal respiratory effort.  Musculoskeletal: normal composite fist on the left hand. Left 5th digit with a laceration to the palmar fat pad. No nail injury or avulsion. No deformity noted.  Nontender with normal range of motion in all extremities.  Neurologic:  Normal gross sensation. Normal speech and language. No gross focal neurologic deficits are appreciated. Skin:  Skin is warm, dry and intact. No rash noted. ____________________________________________   RADIOLOGY  DG Left Hand  IMPRESSION: Negative. ____________________________________________  PROCEDURES  Tdap 0.5 ml IM Norco 5-325 mg PO Augmentin 875 mg PO  .Marland KitchenLaceration Repair  Date/Time: 02/21/2020 8:21 PM Performed by: Melvenia Needles, PA-C Authorized by: Melvenia Needles, PA-C   Consent:    Consent obtained:  Verbal   Consent given by:  Patient   Risks discussed:  Infection and poor wound healing   Alternatives discussed:  No treatment Anesthesia (see MAR for exact dosages):    Anesthesia method:  Nerve block   Block location:  Flexor tendon sheath   Block needle gauge:  27 G   Block anesthetic:  Lidocaine 1% w/o epi   Block technique:  Transthecal block   Block injection procedure:  Anatomic landmarks palpated, introduced needle and incremental injection   Block outcome:  Anesthesia achieved Laceration details:    Location:  Finger   Finger location:  L small finger   Length (cm):  1   Depth (mm):  3 Repair type:    Repair type:  Simple Pre-procedure details:    Preparation:  Patient was prepped and draped in usual sterile fashion Treatment:    Area cleansed with:  Soap and  water   Amount of cleaning:  Standard   Irrigation solution:  Tap water   Irrigation method:  Tap Skin repair:    Repair method:  Steri-Strips   Number of Steri-Strips:  4 Approximation:    Approximation:  Close Post-procedure details:    Dressing:  Open (no dressing)   Patient tolerance of procedure:  Tolerated well, no immediate complications   ____________________________________________  INITIAL IMPRESSION / ASSESSMENT AND PLAN / ED COURSE  Patient with ED evaluation of dog bite to the left pinky.  Patient tetanus updated at this visit and she started empirically on Augmentin.  X-rays negative  for any acute bony injury, retained foreign body, subcutaneous air.  The wound is cleansed and repaired using Steri-Strips.  Patient is discharged with wound care instructions and supplies.  She will follow-up with a primary provider return to the ED as necessary.  We discussed rabies prophylaxis at this visit, but given that the dog is otherwise healthy, and well cared for, the risk of rabies is extremely low.  Patient will follow up with the dog owner and return to the ED if decisions made to prophylax within the next week.  Nichole Cain was evaluated in Emergency Department on 02/21/2020 for the symptoms described in the history of present illness. She was evaluated in the context of the global COVID-19 pandemic, which necessitated consideration that the patient might be at risk for infection with the SARS-CoV-2 virus that causes COVID-19. Institutional protocols and algorithms that pertain to the evaluation of patients at risk for COVID-19 are in a state of rapid change based on information released by regulatory bodies including the CDC and federal and state organizations. These policies and algorithms were followed during the patient's care in the ED.  I reviewed the patient's prescription history over the last 12 months in the multi-state controlled substances database(s) that includes  [place], [place], [place], [place], [place], [place], [place], [place], [place], [place], [place], [place], [place], and [place].  Results were notable for BZD Rx x 1. ____________________________________________  FINAL CLINICAL IMPRESSION(S) / ED DIAGNOSES  Final diagnoses:  Dog bite, initial encounter      Melvenia Needles, PA-C 02/21/20 2330    Harvest Dark, MD 02/22/20 2116

## 2020-02-21 NOTE — ED Notes (Signed)
Pt is ambulatory to the room; pt reports dog bite to left 5th digit; bite is noted with some bleeding.  Pt reports pain is 8/10.

## 2020-02-21 NOTE — ED Triage Notes (Signed)
Patient c/o dog bite to left hand. Puncture to fifth digit left hand. Patient is unsure if other dog has had it's rabies shot during triage; patient is messaging other dog owner at this time.

## 2020-02-21 NOTE — Discharge Instructions (Signed)
Your exam and XR are essentially normal at this time. You have been treated with a tetanus booster and will be given antibiotics. Keep the wound clean, dry, and covered. Follow-up with your provider for wound check as needed. The risk of rabies in this case is low/nil. Return if needed.

## 2020-03-10 DIAGNOSIS — N920 Excessive and frequent menstruation with regular cycle: Secondary | ICD-10-CM | POA: Diagnosis not present

## 2020-03-10 DIAGNOSIS — D259 Leiomyoma of uterus, unspecified: Secondary | ICD-10-CM | POA: Diagnosis not present

## 2020-03-24 DIAGNOSIS — Z30430 Encounter for insertion of intrauterine contraceptive device: Secondary | ICD-10-CM | POA: Diagnosis not present

## 2020-03-24 DIAGNOSIS — N920 Excessive and frequent menstruation with regular cycle: Secondary | ICD-10-CM | POA: Diagnosis not present

## 2020-06-16 ENCOUNTER — Other Ambulatory Visit: Payer: Self-pay

## 2020-06-16 ENCOUNTER — Encounter: Payer: Self-pay | Admitting: Nurse Practitioner

## 2020-06-16 ENCOUNTER — Telehealth (INDEPENDENT_AMBULATORY_CARE_PROVIDER_SITE_OTHER): Payer: BC Managed Care – PPO | Admitting: Nurse Practitioner

## 2020-06-16 VITALS — HR 72 | Temp 101.2°F | Resp 18 | Ht 64.0 in | Wt 180.0 lb

## 2020-06-16 DIAGNOSIS — Z20822 Contact with and (suspected) exposure to covid-19: Secondary | ICD-10-CM | POA: Insufficient documentation

## 2020-06-16 DIAGNOSIS — J069 Acute upper respiratory infection, unspecified: Secondary | ICD-10-CM | POA: Diagnosis not present

## 2020-06-16 DIAGNOSIS — H9202 Otalgia, left ear: Secondary | ICD-10-CM | POA: Diagnosis not present

## 2020-06-16 LAB — POCT INFLUENZA A/B
Influenza A, POC: NEGATIVE
Influenza B, POC: NEGATIVE

## 2020-06-16 LAB — POCT RAPID STREP A (OFFICE): Rapid Strep A Screen: NEGATIVE

## 2020-06-16 NOTE — Progress Notes (Signed)
Virtual Visit via Video note  This visit type was conducted due to national recommendations for restrictions regarding the COVID-19 pandemic (e.g. social distancing).  This format is felt to be most appropriate for this patient at this time.  All issues noted in this document were discussed and addressed.  No physical exam was performed (except for noted visual exam findings with Video Visits).   I connected with@ on 06/16/20 at  1:30 PM EDT by a video enabled telemedicine application or telephone and verified that I am speaking with the correct person using two identifiers. Location patient: home Location provider: work or home office Persons participating in the virtual visit: patient, provider  I discussed the limitations, risks, security and privacy concerns of performing an evaluation and management service by telephone and the availability of in person appointments. I also discussed with the patient that there may be a patient responsible charge related to this service. The patient expressed understanding and agreed to proceed.  Reason for visit: ear infection/fever/HA  started yesterday.   HPI: This 34 year old with history of bilateral hearing loss, allergic rhinitis, reports onset yesterday of fever, raging headache, fatigue, body aches, and pain in the left ear.  Had a T-max of 101.2.  She treated this with hot tea, ibuprofen 800 mg, and that helped.  She has chronic hearing loss and wears hearing aids bilaterally but her ears are too sensitive to wear them now.  She has more pain in the left ear.  No ear drainage.  She has a history of ear infections and this feels like this could be the start of an ear infection.  There is no nasal congestion, but she feels like she has little allergy flare.  No sore throat, cough, wheezing, shortness of breath,CP.   No Covid testing or vaccines. No risk of pregnancy with Mirena IUD. Lives alone. No ill contacts. Works from home.   ROS: See pertinent  positives and negatives per HPI.  Past Medical History:  Diagnosis Date  . Allergy   . Anxiety   . Hearing loss    wears hearing aids b/l   . Sinusitis    right maxillary    Past Surgical History:  Procedure Laterality Date  . INNER EAR SURGERY     tubes x 9 and skin grafts in ear drums  . MYOMECTOMY      Family History  Problem Relation Age of Onset  . Anxiety disorder Mother   . Depression Mother   . Heart disease Mother        MI  . Hyperlipidemia Mother   . Hypertension Mother   . Heart disease Maternal Grandfather        CHF  . Cancer Paternal Grandfather        testicular cancer    SOCIAL HX: Current some day smoker   Current Outpatient Medications:  .  cetirizine (ZYRTEC) 10 MG tablet, Take 10 mg by mouth daily as needed for allergies., Disp: , Rfl:  .  diazepam (VALIUM) 5 MG tablet, Take 1 tablet (5 mg total) by mouth daily as needed for anxiety., Disp: 20 tablet, Rfl: 0 .  fluticasone (FLONASE) 50 MCG/ACT nasal spray, Place into both nostrils daily., Disp: , Rfl:  .  levonorgestrel (MIRENA) 20 MCG/24HR IUD, 1 each by Intrauterine route once., Disp: , Rfl:  .  montelukast (SINGULAIR) 10 MG tablet, Take 1 tablet (10 mg total) by mouth at bedtime., Disp: 90 tablet, Rfl: 3  EXAM:  VITALS per patient  if applicable: Temperature this morning 101.2 weight 180  GENERAL: alert, oriented, appears mildly ill and fatigued, in no acute distress  HEENT: atraumatic, conjunctiva clear, no obvious abnormalities on inspection of external nose and ears. No nasal congestion noted  NECK: normal movements of the head and neck  LUNGS: on inspection no signs of respiratory distress, breathing rate appears normal, no obvious gross SOB, gasping or wheezing  CV: no obvious cyanosis  MS: moves all visible extremities without noticeable abnormality  PSYCH/NEURO: pleasant and cooperative, no obvious depression or anxiety, speech and thought processing grossly intact  ASSESSMENT  AND PLAN:  Discussed the following assessment and plan:  Upper respiratory tract infection, unspecified type - Plan: POCT Influenza A/B, POCT rapid strep A  Suspected COVID-19 virus infection - Plan: Novel Coronavirus, NAA ([facility])  Earache on left - Plan: POCT rapid strep A  No problem-specific Assessment & Plan notes found for this encounter.   Please go to the [facility] behind the building to get throat cultures for strep, influenza, and Covid test.  Get rest, drink plenty of fluids, and use tylenol or ibuprofen as needed for pain. Follow up if symptoms worsen or if symptoms are not improved in 3 days.   Monitor for fever.  Take a temperature at least 30 minutes after eating or drinking.  Take it in between doses of Tylenol or ibuprofen so they are not artificially masking the fever.  Monitor left ear and headache discomfort.  This may be transitory if this is Covid or other virus.  If it does continue with a negative strep and influenza, I will consider an antibiotic while we are waiting for the Covid to result.  Until the Covid results are known, assume that you are positive and quarantine at home.  Leave the house only for medical care and wear a mask.  Further directions pending results.  I discussed the assessment and treatment plan with the patient. The patient was provided an opportunity to ask questions and all were answered. The patient agreed with the plan and demonstrated an understanding of the instructions.   The patient was advised to call back or seek an in-person evaluation if the symptoms worsen or if the condition fails to improve as anticipated.  Lab Addendum: Neg flu, strep and Covid. I ordered the Augmentin- antibiotic and a probiotic to take with it to help prevent diarrhea. Please seek in person evaluation at Acute Care if no improvement in 2-3 days.  Denice Paradise, NP Adult Nurse Practitioner [place] [phone]

## 2020-06-16 NOTE — Patient Instructions (Addendum)
Please go to the [facility] behind the building to get throat cultures for strep, influenza, and Covid test.  Get rest, drink plenty of fluids, and use tylenol or ibuprofen as needed for pain. Follow up if symptoms worsen or if symptoms are not improved in 3 days.   Monitor for fever.  Take a temperature at least 30 minutes after eating or drinking.  Take it in between doses of Tylenol or ibuprofen so they are not artificially masking the fever.  Monitor left ear and headache discomfort.  This may be transitory if this is Covid or other virus.  If it does continue with a negative strep and influenza, I will consider an antibiotic while we are waiting for the Covid to result.  Until the Covid results are known, assume that you are positive and quarantine at home.  Leave the house only for medical care and wear a mask.  Further directions pending results.    Upper Respiratory Infection, Adult An upper respiratory infection (URI) is a common viral infection of the nose, throat, and upper air passages that lead to the lungs. The most common type of URI is the common cold. URIs usually get better on their own, without medical treatment. What are the causes? A URI is caused by a virus. You may catch a virus by:  Breathing in droplets from an infected person's cough or sneeze.  Touching something that has been exposed to the virus (contaminated) and then touching your mouth, nose, or eyes. What increases the risk? You are more likely to get a URI if:  You are very young or very old.  It is autumn or winter.  You have close contact with others, such as at a daycare, school, or health care facility.  You smoke.  You have long-term (chronic) heart or lung disease.  You have a weakened disease-fighting (immune) system.  You have nasal allergies or asthma.  You are experiencing a lot of stress.  You work in an area that has poor air circulation.  You have poor  nutrition. What are the signs or symptoms? A URI usually involves some of the following symptoms:  Runny or stuffy (congested) nose.  Sneezing.  Cough.  Sore throat.  Headache.  Fatigue.  Fever.  Loss of appetite.  Pain in your forehead, behind your eyes, and over your cheekbones (sinus pain).  Muscle aches.  Redness or irritation of the eyes.  Pressure in the ears or face. How is this diagnosed? This condition may be diagnosed based on your medical history and symptoms, and a physical exam. Your health care provider may use a cotton swab to take a mucus sample from your nose (nasal swab). This sample can be tested to determine what virus is causing the illness. How is this treated? URIs usually get better on their own within 7-10 days. You can take steps at home to relieve your symptoms. Medicines cannot cure URIs, but your health care provider may recommend certain medicines to help relieve symptoms, such as:  Over-the-counter cold medicines.  Cough suppressants. Coughing is a type of defense against infection that helps to clear the respiratory system, so take these medicines only as recommended by your health care provider.  Fever-reducing medicines. Follow these instructions at home: Activity  Rest as needed.  If you have a fever, stay home from work or school until your fever is gone or until your health care provider says you are no longer contagious. Your health care provider may  have you wear a face mask to prevent your infection from spreading. Relieving symptoms  Gargle with a salt-water mixture 3-4 times a day or as needed. To make a salt-water mixture, completely dissolve -1 tsp of salt in 1 cup of warm water.  Use a cool-mist humidifier to add moisture to the air. This can help you breathe more easily. Eating and drinking   Drink enough fluid to keep your urine pale yellow.  Eat soups and other clear broths. General instructions   Take  over-the-counter and prescription medicines only as told by your health care provider. These include cold medicines, fever reducers, and cough suppressants.  Do not use any products that contain nicotine or tobacco, such as cigarettes and e-cigarettes. If you need help quitting, ask your health care provider.  Stay away from secondhand smoke.  Stay up to date on all immunizations, including the yearly (annual) flu vaccine.  Keep all follow-up visits as told by your health care provider. This is important. How to prevent the spread of infection to others   URIs can be passed from person to person (are contagious). To prevent the infection from spreading: ? Wash your hands often with soap and water. If soap and water are not available, use hand sanitizer. ? Avoid touching your mouth, face, eyes, or nose. ? Cough or sneeze into a tissue or your sleeve or elbow instead of into your hand or into the air. Contact a health care provider if:  You are getting worse instead of better.  You have a fever or chills.  Your mucus is brown or red.  You have yellow or brown discharge coming from your nose.  You have pain in your face, especially when you bend forward.  You have swollen neck glands.  You have pain while swallowing.  You have white areas in the back of your throat. Get help right away if:  You have shortness of breath that gets worse.  You have severe or persistent: ? Headache. ? Ear pain. ? Sinus pain. ? Chest pain.  You have chronic lung disease along with any of the following: ? Wheezing. ? Prolonged cough. ? Coughing up blood. ? A change in your usual mucus.  You have a stiff neck.  You have changes in your: ? Vision. ? Hearing. ? Thinking. ? Mood. Summary  An upper respiratory infection (URI) is a common infection of the nose, throat, and upper air passages that lead to the lungs.  A URI is caused by a virus.  URIs usually get better on their own  within 7-10 days.  Medicines cannot cure URIs, but your health care provider may recommend certain medicines to help relieve symptoms. This information is not intended to replace advice given to you by your health care provider. Make sure you discuss any questions you have with your health care provider. Document Revised: 08/07/2018 Document Reviewed: 03/15/2017 Elsevier Patient Education  [place], Adult An earache, or ear pain, can be caused by many things, including:  An infection.  Ear wax buildup.  Ear pressure.  Something in the ear that should not be there (foreign body).  A sore throat.  Tooth problems.  Jaw problems. Treatment of the earache will depend on the cause. If the cause is not clear or cannot be determined, you may need to watch your symptoms until your earache goes away or until a cause is found. Follow these instructions at home: Medicines  Take or apply over-the-counter and  prescription medicines only as told by your health care provider.  If you were prescribed an antibiotic medicine, use it as told by your health care provider. Do not stop using the antibiotic even if you start to feel better.  Do not put anything in your ear other than medicine that is prescribed by your health care provider. Managing pain If directed, apply heat to the affected area as often as told by your health care provider. Use the heat source that your health care provider recommends, such as a moist heat pack or a heating pad.  Place a towel between your skin and the heat source.  Leave the heat on for 20-30 minutes.  Remove the heat if your skin turns bright red. This is especially important if you are unable to feel pain, heat, or cold. You may have a greater risk of getting burned. If directed, put ice on the affected area as often as told by your health care provider. To do this:      Put ice in a plastic bag.  Place a towel between your skin and  the bag.  Leave the ice on for 20 minutes, 2-3 times a day. General instructions  Pay attention to any changes in your symptoms.  Try resting in an upright position instead of lying down. This may help to reduce pressure in your ear and relieve pain.  Chew gum if it helps to relieve your ear pain.  Treat any allergies as told by your health care provider.  Drink enough fluid to keep your urine pale yellow.  It is up to you to get the results of any tests that were done. Ask your health care provider, or the department that is doing the tests, when your results will be ready.  Keep all follow-up visits as told by your health care provider. This is important. Contact a health care provider if:  Your pain does not improve within 2 days.  Your earache gets worse.  You have new symptoms.  You have a fever. Get help right away if you:  Have a severe headache.  Have a stiff neck.  Have trouble swallowing.  Have redness or swelling behind your ear.  Have fluid or blood coming from your ear.  Have hearing loss.  Feel dizzy. Summary  An earache, or ear pain, can be caused by many things.  Treatment of the earache will depend on the cause. Follow recommendations from your health care provider to treat your ear pain.  If the cause is not clear or cannot be determined, you may need to watch your symptoms until your earache goes away or until a cause is found.  Keep all follow-up visits as told by your health care provider. This is important. This information is not intended to replace advice given to you by your health care provider. Make sure you discuss any questions you have with your health care provider. Document Revised: 03/07/2019 Document Reviewed: 03/07/2019 Elsevier Patient Education  [place].

## 2020-06-17 ENCOUNTER — Telehealth: Payer: Self-pay | Admitting: Internal Medicine

## 2020-06-17 ENCOUNTER — Encounter: Payer: Self-pay | Admitting: Nurse Practitioner

## 2020-06-17 LAB — NOVEL CORONAVIRUS, NAA: SARS-CoV-2, NAA: NOT DETECTED

## 2020-06-17 LAB — SARS-COV-2, NAA 2 DAY TAT

## 2020-06-17 MED ORDER — AMOXICILLIN-POT CLAVULANATE 875-125 MG PO TABS: 1.0000 | ORAL_TABLET | Freq: Two times a day (BID) | ORAL | 0 refills | Status: AC

## 2020-06-17 MED ORDER — SACCHAROMYCES BOULARDII 250 MG PO CAPS: 250.0000 mg | ORAL_CAPSULE | Freq: Two times a day (BID) | ORAL | 0 refills | Status: AC

## 2020-06-17 NOTE — Telephone Encounter (Signed)
Patient called in stated that she is not feeling any better wanted the antibiotics called in for her.

## 2020-06-17 NOTE — Telephone Encounter (Signed)
Patient aware of below message and will seek care if gets no better

## 2020-06-17 NOTE — Telephone Encounter (Signed)
I ordered Augmentin  and Florastor- check the price on Florastor and if too high- choose an OTC generic probiotic- pharmacist may advise.   Rest, hydrate . If no improvement in 2-3 days- seek in-person evaluation at [facility].

## 2020-07-20 DIAGNOSIS — Z20822 Contact with and (suspected) exposure to covid-19: Secondary | ICD-10-CM | POA: Diagnosis not present

## 2020-09-27 DIAGNOSIS — Z03818 Encounter for observation for suspected exposure to other biological agents ruled out: Secondary | ICD-10-CM | POA: Diagnosis not present

## 2020-09-27 DIAGNOSIS — Z20822 Contact with and (suspected) exposure to covid-19: Secondary | ICD-10-CM | POA: Diagnosis not present

## 2021-03-14 ENCOUNTER — Encounter: Payer: Self-pay | Admitting: Internal Medicine

## 2021-03-14 NOTE — Telephone Encounter (Signed)
Please advise, does Patient need an appointment?

## 2021-03-15 NOTE — Addendum Note (Signed)
Addended by: Orland Mustard on: 03/15/2021 01:22 PM   Modules accepted: Orders

## 2021-06-08 ENCOUNTER — Encounter: Payer: Self-pay | Admitting: Emergency Medicine

## 2021-06-08 ENCOUNTER — Encounter: Payer: Self-pay | Admitting: Internal Medicine

## 2021-06-08 ENCOUNTER — Other Ambulatory Visit: Payer: Self-pay

## 2021-06-08 ENCOUNTER — Ambulatory Visit
Admission: EM | Admit: 2021-06-08 | Discharge: 2021-06-08 | Disposition: A | Discharge status: Home / Self Care | Payer: BC Managed Care – PPO | Attending: Emergency Medicine | Admitting: Emergency Medicine

## 2021-06-08 DIAGNOSIS — J029 Acute pharyngitis, unspecified: Secondary | ICD-10-CM | POA: Insufficient documentation

## 2021-06-08 LAB — POCT RAPID STREP A (OFFICE): Rapid Strep A Screen: NEGATIVE

## 2021-06-08 MED ORDER — LIDOCAINE VISCOUS HCL 2 % MT SOLN: 15.0000 mL | OROMUCOSAL | 0 refills | Status: DC | PRN

## 2021-06-08 NOTE — ED Triage Notes (Signed)
Patient in office today with sorethroat mainly on right side hard to swollen with white speck in back of throat .  Ear and jaw pain OTC: Ibu and nyquil Pain level : 9

## 2021-06-08 NOTE — ED Provider Notes (Signed)
Roderic Palau    CSN: [id number] Arrival date & time: 06/08/21  0802      History   Chief Complaint Chief Complaint  Patient presents with   Sore Throat     HPI Nichole Cain is a 35 y.o. female.  Patient presents with sore throat and hoarseness x2 days.  Treatment at home with ibuprofen and NyQuil.  She denies fever, chills, rash, cough, shortness of breath, or other symptoms.  She reports history of frequent strep throat; none in the past 2 to 3 years.  The history is provided by the patient and medical records.   Past Medical History:  Diagnosis Date   Allergy    Anxiety    Hearing loss    wears hearing aids b/l    Sinusitis    right maxillary    Patient Active Problem List   Diagnosis Date Noted   Upper respiratory tract infection 06/16/2020   Suspected COVID-19 virus infection 06/16/2020   Earache on left 06/16/2020   GAD (generalized anxiety disorder) 01/14/2020   Panic attack 01/14/2020   Sensorineural hearing loss (SNHL) of both ears 10/22/2019   Submucous myoma of uterus 09/01/2019   Fibroids 07/16/2019   Annual physical exam 07/16/2019   Hyperlipidemia 07/16/2019   Bilateral hearing loss 01/14/2019   Allergic rhinitis 01/14/2019   Menses painful 01/14/2019   Sprain of right great toe 10/17/2017    Past Surgical History:  Procedure Laterality Date   INNER EAR SURGERY     tubes x 9 and skin grafts in ear drums   MYOMECTOMY      OB History   No obstetric history on file.      Home Medications    Prior to Admission medications   Medication Sig Start Date End Date Taking? Authorizing Provider  lidocaine (XYLOCAINE) 2 % solution Use as directed 15 mLs in the mouth or throat as needed for mouth pain. 06/08/21  Yes Sharion Balloon, NP  cetirizine (ZYRTEC) 10 MG tablet Take 10 mg by mouth daily as needed for allergies.    [provider]  diazepam (VALIUM) 5 MG tablet Take 1 tablet (5 mg total) by mouth daily as needed for  anxiety. 01/20/20   McLean-Scocuzza, Nino Glow, MD  fluticasone (FLONASE) 50 MCG/ACT nasal spray Place into both nostrils daily.    [provider]  levonorgestrel (MIRENA) 20 MCG/24HR IUD 1 each by Intrauterine route once.    [provider]  montelukast (SINGULAIR) 10 MG tablet Take 1 tablet (10 mg total) by mouth at bedtime. 01/14/19   McLean-Scocuzza, Nino Glow, MD    Family History Family History  Problem Relation Age of Onset   Anxiety disorder Mother    Depression Mother    Heart disease Mother        MI   Hyperlipidemia Mother    Hypertension Mother    Heart disease Maternal Grandfather        CHF   Cancer Paternal Grandfather        testicular cancer    Social History Social History   Tobacco Use   Smoking status: Some Days   Smokeless tobacco: Never   Tobacco comments:    smoker since 61s to 2017 10+ years max 1 pk lasting 2 days to 1 ppd   Substance Use Topics   Alcohol use: Yes    Comment: occasionally    Drug use: Not Currently     Allergies   Patient has no known allergies.  Review of Systems Review of Systems  Constitutional:  Negative for chills and fever.  HENT:  Positive for sore throat and voice change. Negative for ear pain.   Respiratory:  Negative for cough and shortness of breath.   Cardiovascular:  Negative for chest pain and palpitations.  Skin:  Negative for color change and rash.  All other systems reviewed and are negative.   Physical Exam Triage Vital Signs ED Triage Vitals  Enc Vitals Group     BP      Pulse      Resp      Temp      Temp src      SpO2      Weight      Height      Head Circumference      Peak Flow      Pain Score      Pain Loc      Pain Edu?      Excl. in [place]?    No data found.  Updated Vital Signs BP 115/81 (BP Location: Left Arm)   Pulse 78   Temp 98.7 F (37.1 C) (Oral)   Resp 16   Ht 5\' 4"  (1.626 m)   Wt 185 lb (83.9 kg)   LMP 06/01/2021   SpO2 97%   BMI 31.76 kg/m   Visual  Acuity Right Eye Distance:   Left Eye Distance:   Bilateral Distance:    Right Eye Near:   Left Eye Near:    Bilateral Near:     Physical Exam Vitals and nursing note reviewed.  Constitutional:      General: She is not in acute distress.    Appearance: She is well-developed.  HENT:     Head: Normocephalic and atraumatic.     Right Ear: Tympanic membrane normal.     Left Ear: Tympanic membrane normal.     Nose: Nose normal.     Mouth/Throat:     Mouth: Mucous membranes are moist.     Pharynx: Posterior oropharyngeal erythema present.  Eyes:     Conjunctiva/sclera: Conjunctivae normal.  Cardiovascular:     Rate and Rhythm: Normal rate and regular rhythm.     Heart sounds: Normal heart sounds.  Pulmonary:     Effort: Pulmonary effort is normal. No respiratory distress.     Breath sounds: Normal breath sounds.  Abdominal:     Palpations: Abdomen is soft.     Tenderness: There is no abdominal tenderness.  Musculoskeletal:     Cervical back: Neck supple.  Skin:    General: Skin is warm and dry.  Neurological:     General: No focal deficit present.     Mental Status: She is alert and oriented to person, place, and time.  Psychiatric:        Mood and Affect: Mood normal.        Behavior: Behavior normal.     UC Treatments / Results  Labs (all labs ordered are listed, but only abnormal results are displayed) Labs Reviewed  COVID-19, FLU A+B NAA  CULTURE, GROUP A STREP [facility])  POCT RAPID STREP A (OFFICE)    EKG   Radiology No results found.  Procedures Procedures (including critical care time)  Medications Ordered in UC Medications - No data to display  Initial Impression / Assessment and Plan / UC Course  I have reviewed the triage vital signs and the nursing notes.  Pertinent labs & imaging results that were available during  my care of the patient were reviewed by me and considered in my medical decision making (see chart for details).   Sore throat.   Rapid strep negative. COVID pending.  Instructed patient to self quarantine per CDC guidelines.  Discussed symptomatic treatment including viscous lidocaine, Tylenol or ibuprofen, rest, hydration.  Instructed patient to follow up with PCP if symptoms are not improving.  Patient agrees to plan of care.    Final Clinical Impressions(s) / UC Diagnoses   Final diagnoses:  Sore throat     Discharge Instructions      Your strep test is negative.    Your COVID test is pending.  You should self quarantine until the test result is back.    Take Tylenol or ibuprofen as needed for fever or discomfort.  Rest and keep yourself hydrated.    Follow-up with your primary care provider if your symptoms are not improving.         ED Prescriptions     Medication Sig Dispense Auth. Provider   lidocaine (XYLOCAINE) 2 % solution Use as directed 15 mLs in the mouth or throat as needed for mouth pain. 100 mL Sharion Balloon, NP      PDMP not reviewed this encounter.   Sharion Balloon, NP 06/08/21 [phone]

## 2021-06-08 NOTE — Discharge Instructions (Addendum)
Your strep test is negative.    Your COVID test is pending.  You should self quarantine until the test result is back.    Take Tylenol or ibuprofen as needed for fever or discomfort.  Rest and keep yourself hydrated.    Follow-up with your primary care provider if your symptoms are not improving.

## 2021-06-09 ENCOUNTER — Emergency Department: Payer: BC Managed Care – PPO

## 2021-06-09 ENCOUNTER — Emergency Department
Admission: EM | Admit: 2021-06-09 | Discharge: 2021-06-09 | Disposition: A | Discharge status: Home / Self Care | Payer: BC Managed Care – PPO | Attending: Emergency Medicine | Admitting: Emergency Medicine

## 2021-06-09 ENCOUNTER — Other Ambulatory Visit: Payer: Self-pay

## 2021-06-09 ENCOUNTER — Telehealth (INDEPENDENT_AMBULATORY_CARE_PROVIDER_SITE_OTHER): Payer: BC Managed Care – PPO | Admitting: Internal Medicine

## 2021-06-09 DIAGNOSIS — J029 Acute pharyngitis, unspecified: Secondary | ICD-10-CM | POA: Insufficient documentation

## 2021-06-09 DIAGNOSIS — Z20822 Contact with and (suspected) exposure to covid-19: Secondary | ICD-10-CM | POA: Diagnosis not present

## 2021-06-09 DIAGNOSIS — J358 Other chronic diseases of tonsils and adenoids: Secondary | ICD-10-CM | POA: Diagnosis not present

## 2021-06-09 DIAGNOSIS — R590 Localized enlarged lymph nodes: Secondary | ICD-10-CM | POA: Diagnosis not present

## 2021-06-09 LAB — CBC WITH DIFFERENTIAL/PLATELET
Abs Immature Granulocytes: 0.06 10*3/uL (ref 0.00–0.07)
Basophils Absolute: 0 10*3/uL (ref 0.0–0.1)
Basophils Relative: 0 %
Eosinophils Absolute: 0.2 10*3/uL (ref 0.0–0.5)
Eosinophils Relative: 3 %
HCT: 40.7 % (ref 36.0–46.0)
Hemoglobin: 14.5 g/dL (ref 12.0–15.0)
Immature Granulocytes: 1 %
Lymphocytes Relative: 11 %
Lymphs Abs: 0.9 10*3/uL (ref 0.7–4.0)
MCH: 34.4 pg — ABNORMAL HIGH (ref 26.0–34.0)
MCHC: 35.6 g/dL (ref 30.0–36.0)
MCV: 96.4 fL (ref 80.0–100.0)
Monocytes Absolute: 0.8 10*3/uL (ref 0.1–1.0)
Monocytes Relative: 10 %
Neutro Abs: 6.5 10*3/uL (ref 1.7–7.7)
Neutrophils Relative %: 75 %
Platelets: 220 10*3/uL (ref 150–400)
RBC: 4.22 MIL/uL (ref 3.87–5.11)
RDW: 11.7 % (ref 11.5–15.5)
WBC: 8.6 10*3/uL (ref 4.0–10.5)
nRBC: 0 % (ref 0.0–0.2)

## 2021-06-09 LAB — GROUP A STREP BY PCR: Group A Strep by PCR: NOT DETECTED

## 2021-06-09 LAB — COVID-19, FLU A+B NAA
Influenza A, NAA: NOT DETECTED
Influenza B, NAA: NOT DETECTED
SARS-CoV-2, NAA: NOT DETECTED

## 2021-06-09 LAB — HCG, QUANTITATIVE, PREGNANCY: hCG, Beta Chain, Quant, S: 1 m[IU]/mL (ref <5)

## 2021-06-09 LAB — BASIC METABOLIC PANEL
Anion gap: 4 — ABNORMAL LOW (ref 5–15)
BUN: 6 mg/dL (ref 6–20)
CO2: 28 mmol/L (ref 22–32)
Calcium: 9.4 mg/dL (ref 8.9–10.3)
Chloride: 105 mmol/L (ref 98–111)
Creatinine, Ser: 0.66 mg/dL (ref 0.44–1.00)
GFR, Estimated: >60 mL/min (ref >60)
Glucose, Bld: 96 mg/dL (ref 70–99)
Potassium: 3.9 mmol/L (ref 3.5–5.1)
Sodium: 137 mmol/L (ref 135–145)

## 2021-06-09 LAB — CHLAMYDIA/NGC RT PCR (ARMC ONLY)
Chlamydia Tr: NOT DETECTED
N gonorrhoeae: NOT DETECTED

## 2021-06-09 LAB — RESP PANEL BY RT-PCR (FLU A&B, COVID) ARPGX2
Influenza A by PCR: NEGATIVE
Influenza B by PCR: NEGATIVE
SARS Coronavirus 2 by RT PCR: NEGATIVE

## 2021-06-09 LAB — MONONUCLEOSIS SCREEN: Mono Screen: NEGATIVE

## 2021-06-09 MED ORDER — DEXAMETHASONE SODIUM PHOSPHATE 10 MG/ML IJ SOLN
10.0000 mg | Freq: Once | INTRAMUSCULAR | Status: AC
  Administered 2021-06-09: 10 mg via INTRAVENOUS
  Filled 2021-06-09: qty 1

## 2021-06-09 MED ORDER — IOHEXOL 300 MG/ML  SOLN
75.0000 mL | Freq: Once | INTRAMUSCULAR | Status: AC | PRN
  Administered 2021-06-09: 75 mL via INTRAVENOUS
  Filled 2021-06-09: qty 75

## 2021-06-09 MED ORDER — SODIUM CHLORIDE 0.9 % IV BOLUS
1000.0000 mL | Freq: Once | INTRAVENOUS | Status: AC
  Administered 2021-06-09: 1000 mL via INTRAVENOUS

## 2021-06-09 MED ORDER — AMOXICILLIN-POT CLAVULANATE 875-125 MG PO TABS: 1.0000 | ORAL_TABLET | Freq: Two times a day (BID) | ORAL | 0 refills | Status: DC

## 2021-06-09 MED ORDER — SODIUM CHLORIDE 0.9 % IV SOLN
1.0000 g | Freq: Once | INTRAVENOUS | Status: AC
  Administered 2021-06-09: 1 g via INTRAVENOUS
  Filled 2021-06-09: qty 10

## 2021-06-09 MED ORDER — KETOROLAC TROMETHAMINE 30 MG/ML IJ SOLN
15.0000 mg | Freq: Once | INTRAMUSCULAR | Status: AC
  Administered 2021-06-09: 15 mg via INTRAVENOUS
  Filled 2021-06-09: qty 1

## 2021-06-09 NOTE — ED Triage Notes (Signed)
Pt to ED for sore throat for a few days, states tested neg for strep throat and covid. Has white patchy spots on throat.  Antibiotics ordered today by PCP was told to come to ED d/t not eating /drinking from pain.  RR even and unlabored, NAD noted

## 2021-06-09 NOTE — Telephone Encounter (Signed)
I'm fine with a consult fee. I'm not allergic to any antibiotics. I've been using the over the counter throat spray but it hasn't helped. I have been drinking warm tea with honey and that's helped some but it still feels like I'm swallowing razor blades every time I swallow. Not to mention the pain in my neck and ear but I'm sure it's all just radiating from the same thing. Thankfully it all seems to be centralized on the right side, hasn't spread to both sides yet.     You  Carmin Muskrat 15 hours ago (4:19 PM)   TM We cant just send in antibiotics without an office visit or my chart consult fee? Are you agreeable to my chart consult fee ? Or you can call urgent care and tell them your symptoms are worse and follow up with them there    Are you allergic to antibiotics? Do you get yeast infections with them? You can try cepacol or chloroseptic spray over the counter to numb your throat Soft foods  Warm tea with honey and lemon  Keep me posted on your results as they come in       [place] T, CMA routed conversation to You 21 hours ago (10:08 AM)   Carmin Muskrat  You 21 hours ago (10:04 AM)   I went to urgent care this morning because I believe I have strep throat. The rapid strep test came back negative and a COVID test was sent off. I haven't slept in 24 hours because I'm in so much pain. They ordered a lidocaine mouth wash but the pharmacy didn't have it. I was wondering if you could order a preventative round of antibiotics and possibly enough pain medicine to get me through a day or so, so that I can rest and rehydrate. I can't swallow anything at the moment because it's too painful. I'm willing to come in and do a flu test as well if you think that would be a good idea, they didn't offer me one at urgent care.     A/p sore throat  Rx Augmentin  Supportive care  Pt agreeable to fee  Dr. Olivia Mackie MCLean-Scocuzza

## 2021-06-09 NOTE — ED Provider Notes (Signed)
[facility] Emergency Department Provider Note    Event Date/Time   First MD Initiated Contact with Patient 06/09/21 1232     (approximate)  I have reviewed the triage vital signs and the nursing notes.   HISTORY  Chief Complaint Sore Throat    HPI Nichole Cain is a 35 y.o. female presents to the ER for evaluation of several days of progressively worsening sore throat and trouble swallowing.  Went to urgent care and had negative rapid strep.  Called her PCP today who called in antibiotics but after talking to her and hearing her hoarse voice was told to go to the ER for further evaluation.  Has not been on any recent antibiotics.  Did have oral intercourse over the weekend with some that she had not seen in a long time.   Past Medical History:  Diagnosis Date   Allergy    Anxiety    Hearing loss    wears hearing aids b/l    Sinusitis    right maxillary   Family History  Problem Relation Age of Onset   Anxiety disorder Mother    Depression Mother    Heart disease Mother        MI   Hyperlipidemia Mother    Hypertension Mother    Heart disease Maternal Grandfather        CHF   Cancer Paternal Grandfather        testicular cancer   Past Surgical History:  Procedure Laterality Date   INNER EAR SURGERY     tubes x 9 and skin grafts in ear drums   MYOMECTOMY     Patient Active Problem List   Diagnosis Date Noted   Upper respiratory tract infection 06/16/2020   Suspected COVID-19 virus infection 06/16/2020   Earache on left 06/16/2020   GAD (generalized anxiety disorder) 01/14/2020   Panic attack 01/14/2020   Sensorineural hearing loss (SNHL) of both ears 10/22/2019   Submucous myoma of uterus 09/01/2019   Fibroids 07/16/2019   Annual physical exam 07/16/2019   Hyperlipidemia 07/16/2019   Bilateral hearing loss 01/14/2019   Allergic rhinitis 01/14/2019   Menses painful 01/14/2019   Sprain of right great toe 10/17/2017       Prior to Admission medications   Medication Sig Start Date End Date Taking? Authorizing Provider  amoxicillin-clavulanate (AUGMENTIN) 875-125 MG tablet Take 1 tablet by mouth 2 (two) times daily. With food 06/09/21   McLean-Scocuzza, Nino Glow, MD  cetirizine (ZYRTEC) 10 MG tablet Take 10 mg by mouth daily as needed for allergies.    [provider]  diazepam (VALIUM) 5 MG tablet Take 1 tablet (5 mg total) by mouth daily as needed for anxiety. 01/20/20   McLean-Scocuzza, Nino Glow, MD  fluticasone (FLONASE) 50 MCG/ACT nasal spray Place into both nostrils daily.    [provider]  levonorgestrel (MIRENA) 20 MCG/24HR IUD 1 each by Intrauterine route once.    [provider]  lidocaine (XYLOCAINE) 2 % solution Use as directed 15 mLs in the mouth or throat as needed for mouth pain. 06/08/21   Sharion Balloon, NP  montelukast (SINGULAIR) 10 MG tablet Take 1 tablet (10 mg total) by mouth at bedtime. 01/14/19   McLean-Scocuzza, Nino Glow, MD    Allergies Patient has no known allergies.    Social History Social History   Tobacco Use   Smoking status: Some Days   Smokeless tobacco: Never   Tobacco comments:    smoker  since 70s to 2017 10+ years max 1 pk lasting 2 days to 1 ppd   Substance Use Topics   Alcohol use: Yes    Comment: occasionally    Drug use: Not Currently    Review of Systems Patient denies headaches, rhinorrhea, blurry vision, numbness, shortness of breath, chest pain, edema, cough, abdominal pain, nausea, vomiting, diarrhea, dysuria, fevers, rashes or hallucinations unless otherwise stated above in HPI. ____________________________________________   PHYSICAL EXAM:  VITAL SIGNS: Vitals:   06/09/21 1430 06/09/21 1500  BP: 117/73 122/79  Pulse: 77 77  Resp: 18 19  Temp:    SpO2: 97% 99%    Constitutional: Alert and oriented.  Eyes: Conjunctivae are normal.  Head: Atraumatic. Nose: No congestion/rhinnorhea. Mouth/Throat: Mucous membranes  are moist.  Bilateral tonsillar erythema with white exudates.  Hoarse voice but no stridor.  No trismus.  Uvula is midline. Neck: No stridor. Painless ROM.  Cardiovascular: Normal rate, regular rhythm. Grossly normal heart sounds.  Good peripheral circulation. Respiratory: Normal respiratory effort.  No retractions. Lungs CTAB. Gastrointestinal: Soft and nontender. No distention. No abdominal bruits. No CVA tenderness. Genitourinary:  Musculoskeletal: No lower extremity tenderness nor edema.  No joint effusions. Neurologic:  Normal speech and language. No gross focal neurologic deficits are appreciated. No facial droop Skin:  Skin is warm, dry and intact. No rash noted. Psychiatric: Mood and affect are normal. Speech and behavior are normal.  ____________________________________________   LABS (all labs ordered are listed, but only abnormal results are displayed)  Results for orders placed or performed during the hospital encounter of 06/09/21 (from the past 24 hour(s))  CBC with Differential     Status: Abnormal   Collection Time: 06/09/21 11:54 AM  Result Value Ref Range   WBC 8.6 4.0 - 10.5 K/uL   RBC 4.22 3.87 - 5.11 MIL/uL   Hemoglobin 14.5 12.0 - 15.0 g/dL   HCT 40.7 36.0 - 46.0 %   MCV 96.4 80.0 - 100.0 fL   MCH 34.4 (H) 26.0 - 34.0 pg   MCHC 35.6 30.0 - 36.0 g/dL   RDW 11.7 11.5 - 15.5 %   Platelets 220 150 - 400 K/uL   nRBC 0.0 0.0 - 0.2 %   Neutrophils Relative % 75 %   Neutro Abs 6.5 1.7 - 7.7 K/uL   Lymphocytes Relative 11 %   Lymphs Abs 0.9 0.7 - 4.0 K/uL   Monocytes Relative 10 %   Monocytes Absolute 0.8 0.1 - 1.0 K/uL   Eosinophils Relative 3 %   Eosinophils Absolute 0.2 0.0 - 0.5 K/uL   Basophils Relative 0 %   Basophils Absolute 0.0 0.0 - 0.1 K/uL   Immature Granulocytes 1 %   Abs Immature Granulocytes 0.06 0.00 - 0.07 K/uL  Basic metabolic panel     Status: Abnormal   Collection Time: 06/09/21 11:54 AM  Result Value Ref Range   Sodium 137 135 - 145  mmol/L   Potassium 3.9 3.5 - 5.1 mmol/L   Chloride 105 98 - 111 mmol/L   CO2 28 22 - 32 mmol/L   Glucose, Bld 96 70 - 99 mg/dL   BUN 6 6 - 20 mg/dL   Creatinine, Ser 0.66 0.44 - 1.00 mg/dL   Calcium 9.4 8.9 - 10.3 mg/dL   GFR, Estimated >60 >60 mL/min   Anion gap 4 (L) 5 - 15  Group A Strep by PCR ([facility] Only)     Status: None   Collection Time: 06/09/21 11:54 AM  Specimen: Throat; Sterile Swab  Result Value Ref Range   Group A Strep by PCR NOT DETECTED NOT DETECTED  Resp Panel by RT-PCR (Flu A&B, Covid) Nasopharyngeal Swab     Status: None   Collection Time: 06/09/21 11:54 AM   Specimen: Nasopharyngeal Swab; Nasopharyngeal(NP) swabs in vial transport medium  Result Value Ref Range   SARS Coronavirus 2 by RT PCR NEGATIVE NEGATIVE   Influenza A by PCR NEGATIVE NEGATIVE   Influenza B by PCR NEGATIVE NEGATIVE  Mononucleosis screen     Status: None   Collection Time: 06/09/21 11:54 AM  Result Value Ref Range   Mono Screen NEGATIVE NEGATIVE  hCG, quantitative, pregnancy     Status: None   Collection Time: 06/09/21 11:54 AM  Result Value Ref Range   hCG, Beta Chain, Quant, S 1 <5 mIU/mL  Chlamydia/NGC rt PCR ([facility] only)     Status: None   Collection Time: 06/09/21  1:25 PM   Specimen: Throat  Result Value Ref Range   Specimen source GC/Chlam ENDOCERVICAL    Chlamydia Tr NOT DETECTED NOT DETECTED   N gonorrhoeae NOT DETECTED NOT DETECTED   ____________________________________________ ____________________________________________  RADIOLOGY  I personally reviewed all radiographic images ordered to evaluate for the above acute complaints and reviewed radiology reports and findings.  These findings were personally discussed with the patient.  Please see medical record for radiology report.  ____________________________________________   PROCEDURES  Procedure(s) performed:  Procedures    Critical Care performed: no ____________________________________________   INITIAL  IMPRESSION / ASSESSMENT AND PLAN / ED COURSE  Pertinent labs & imaging results that were available during my care of the patient were reviewed by me and considered in my medical decision making (see chart for details).   DDX: Pharyngitis, tonsillitis, RPA, PTA, Ludwick's, angioedema, viral illness  Shelena Castelluccio is a 35 y.o. who presents to the ED with presentation as described above.  Patient nontoxic-appearing afebrile hemodynamically stable does have erythematous bilateral tonsils with exudates uvula is midline.  Does have hoarse voice no appreciable stridor does have cervical adenopathy.  Blood work is reassuring will send for swabs will give IV antibiotics as well as Decadron.  Given her hoarse voice and trouble swallowing will order CT to evaluate for any sign of epiglottitis or deep space infection.  Clinical Course as of 06/09/21 1629  Fri Jun 09, 2021  1624 Imaging is reassuring.  Patient is feeling significantly better.  Tolerating p.o.  Does appear stable and appropriate for outpatient follow-up.  Patient agreeable to plan. [PR]    Clinical Course User Index [PR] Merlyn Lot, MD    The patient was evaluated in Emergency Department today for the symptoms described in the history of present illness. He/she was evaluated in the context of the global COVID-19 pandemic, which necessitated consideration that the patient might be at risk for infection with the SARS-CoV-2 virus that causes COVID-19. Institutional protocols and algorithms that pertain to the evaluation of patients at risk for COVID-19 are in a state of rapid change based on information released by regulatory bodies including the CDC and federal and state organizations. These policies and algorithms were followed during the patient's care in the ED.  As part of my medical decision making, I reviewed the following data within the [place] notes reviewed and incorporated, Labs reviewed, notes from  prior ED visits and [place] Controlled Substance Database   ____________________________________________   FINAL CLINICAL IMPRESSION(S) / ED DIAGNOSES  Final diagnoses:  Acute pharyngitis, unspecified etiology      NEW MEDICATIONS STARTED DURING THIS VISIT:  New Prescriptions   No medications on file     Note:  This document was prepared using Dragon voice recognition software and may include unintentional dictation errors.    Merlyn Lot, MD 06/09/21 [id number]

## 2021-06-09 NOTE — ED Provider Notes (Signed)
Emergency Medicine Provider Triage Evaluation Note  Nichole Cain , a 35 y.o. female  was evaluated in triage.  Pt complains of sore throat, hoarse voice, difficulty swallowing.  Review of Systems  Positive: Sore throat, laryngitis, difficulty swallowing Negative: Fever, chills, chest pain or shortness of breath, no abdominal pain  Physical Exam  BP 118/87 (BP Location: Left Arm)   Pulse 76   Temp 98.4 F (36.9 C) (Oral)   Resp 18   Ht 5\' 4"  (1.626 m)   Wt 84 kg   LMP 06/01/2021   SpO2 98%   BMI 31.79 kg/m  Gen:   Awake, no distress   Resp:  Normal effort  MSK:   Moves extremities without difficulty  Other:    Medical Decision Making  Medically screening exam initiated at 11:38 AM.  Appropriate orders placed.  Nichole Cain was informed that the remainder of the evaluation will be completed by another provider, this initial triage assessment does not replace that evaluation, and the importance of remaining in the ED until their evaluation is complete.  Patient was seen at urgent care.  Had rapid strep which was negative yesterday.  Pending COVID.  Patient feels that she is gotten much worse overnight.  Ordered strep test, monotest, COVID test, basic labs   Nichole Starks, PA-C 06/09/21 1140    Nichole Divine, MD 06/09/21 [phone]

## 2021-06-09 NOTE — Telephone Encounter (Signed)
According to chart notes Patient being evaluated by [facility]. Does not look as though she has been discharged from their care as of yet.

## 2021-06-09 NOTE — Telephone Encounter (Signed)
Patient calling back in this morning stating that her symptoms have significantly worsened. Patient states she is in so much pain that she can not swallow anything. States she can not swallow any liquids let alone a pill. Patient was crying in pain through out the call, sounded very hoarse and could hardly talk. States she is concerned for severe pain and dehydration from not being able to swallow. Patient states her throat is covered in a white growth.    Informed the Patient with symptoms this severe she needs to be seen at the emergency room as soon as possible. Informed her they would be able to do a throat culture there, if needed they could also give IV fluids for dehydration and IV antibiotics since she can not swallow.   Patient verbalized understanding. States she would go to the ER and disconnected the call.

## 2021-06-09 NOTE — ED Notes (Signed)
See triage note. Pt in with strep-like s/s. Hardly able to hear pt's voice; voice hoarse; pt reports pain and discomfort in throat. Pt sitting calmly in bed; skin dry; resp reg/unlabored. Pt fatigued.

## 2021-06-09 NOTE — Telephone Encounter (Signed)
How is she doing also rec [facility] ED vs [facility] ED

## 2021-06-10 LAB — CULTURE, GROUP A STREP (THRC)

## 2021-07-24 ENCOUNTER — Other Ambulatory Visit: Payer: Self-pay

## 2021-07-24 ENCOUNTER — Encounter: Payer: Self-pay | Admitting: *Deleted

## 2021-07-24 ENCOUNTER — Emergency Department: Payer: BC Managed Care – PPO

## 2021-07-24 ENCOUNTER — Ambulatory Visit: Admission: EM | Admit: 2021-07-24 | Discharge status: Absent without leave | Payer: BC Managed Care – PPO

## 2021-07-24 DIAGNOSIS — R200 Anesthesia of skin: Secondary | ICD-10-CM | POA: Insufficient documentation

## 2021-07-24 DIAGNOSIS — R519 Headache, unspecified: Secondary | ICD-10-CM | POA: Insufficient documentation

## 2021-07-24 DIAGNOSIS — Z5321 Procedure and treatment not carried out due to patient leaving prior to being seen by health care provider: Secondary | ICD-10-CM | POA: Diagnosis not present

## 2021-07-24 LAB — CBG MONITORING, ED: Glucose-Capillary: 117 mg/dL — ABNORMAL HIGH (ref 70–99)

## 2021-07-24 LAB — CBC
HCT: 38.7 % (ref 36.0–46.0)
Hemoglobin: 13.6 g/dL (ref 12.0–15.0)
MCH: 33.7 pg (ref 26.0–34.0)
MCHC: 35.1 g/dL (ref 30.0–36.0)
MCV: 95.8 fL (ref 80.0–100.0)
Platelets: 242 10*3/uL (ref 150–400)
RBC: 4.04 MIL/uL (ref 3.87–5.11)
RDW: 11.9 % (ref 11.5–15.5)
WBC: 6.3 10*3/uL (ref 4.0–10.5)
nRBC: 0 % (ref 0.0–0.2)

## 2021-07-24 LAB — BASIC METABOLIC PANEL
Anion gap: 7 (ref 5–15)
BUN: 16 mg/dL (ref 6–20)
CO2: 27 mmol/L (ref 22–32)
Calcium: 9.6 mg/dL (ref 8.9–10.3)
Chloride: 103 mmol/L (ref 98–111)
Creatinine, Ser: 0.69 mg/dL (ref 0.44–1.00)
GFR, Estimated: >60 mL/min (ref >60)
Glucose, Bld: 99 mg/dL (ref 70–99)
Potassium: 3.7 mmol/L (ref 3.5–5.1)
Sodium: 137 mmol/L (ref 135–145)

## 2021-07-24 LAB — POC URINE PREG, ED: Preg Test, Ur: NEGATIVE

## 2021-07-24 NOTE — ED Triage Notes (Signed)
Pt has numbness in left side of face.  Sx began this am when waking up.  No headache.  No n/v/d.  No dizziness.  No diff swallowing. Pt alert  speech clear.  Pt ambulates without diff.

## 2021-07-24 NOTE — ED Provider Notes (Signed)
Emergency Medicine Provider Triage Evaluation Note  Nichole Cain , a 35 y.o. female  was evaluated in triage.  Pt complains of left-sided facial numbness that started when patient awoke this morning at 7:30 AM.  Patient states that she was hoping that symptoms would resolve on their own throughout the day.  She states that she does have a history of migraines but that she does not currently have a headache.  No similar symptoms in the past.  No weakness in the arms and legs.  No no noticeable facial droop according to the patient.  No falls or mechanisms of trauma.  Patient states that she has had more stress in her life than usual.  Review of Systems  Positive: Patient has facial numbness Negative: No chest pain, chest tightness or abdominal pain.   Physical Exam  BP (!) 140/97 (BP Location: Left Arm)   Pulse 91   Temp 98.9 F (37.2 C) (Oral)   Resp 18   Ht 5\' 4"  (1.626 m)   Wt 88.5 kg   SpO2 99%   BMI 33.47 kg/m  Gen:   Awake, no distress   Resp:  Normal effort  MSK:   Moves extremities without difficulty  Other:    Medical Decision Making  Medically screening exam initiated at 8:12 PM.  Appropriate orders placed.  Nichole Cain was informed that the remainder of the evaluation will be completed by another provider, this initial triage assessment does not replace that evaluation, and the importance of remaining in the ED until their evaluation is complete.     Nichole Cain, Nichole Cain 07/24/21 2013    Nena Polio, MD 07/24/21 2039

## 2021-07-25 ENCOUNTER — Emergency Department
Admission: EM | Admit: 2021-07-25 | Discharge: 2021-07-25 | Disposition: A | Discharge status: Home / Self Care | Payer: BC Managed Care – PPO | Attending: Emergency Medicine | Admitting: Emergency Medicine

## 2021-09-18 ENCOUNTER — Telehealth: Payer: Self-pay | Admitting: Internal Medicine

## 2021-09-18 NOTE — Telephone Encounter (Signed)
Rejection Reason - Patient did not respond - No response from patient." Sammuel Hines said on Sep 18, 2021 3:45 PM  "Scheduling Information - 04/06/21- [facility] for patient to call back to schedule..mb" Sammuel Hines said on Apr 06, 2021 8:40 AM  Msg from [place] [facility]

## 2021-11-04 IMAGING — DX DG HAND COMPLETE 3+V*L*
3 series · 3 of 3 positions shown · non-contrast
Comparison: None.

CLINICAL DATA: Dog bite.

EXAM:
LEFT HAND - COMPLETE 3+ VIEW

[hand ap]
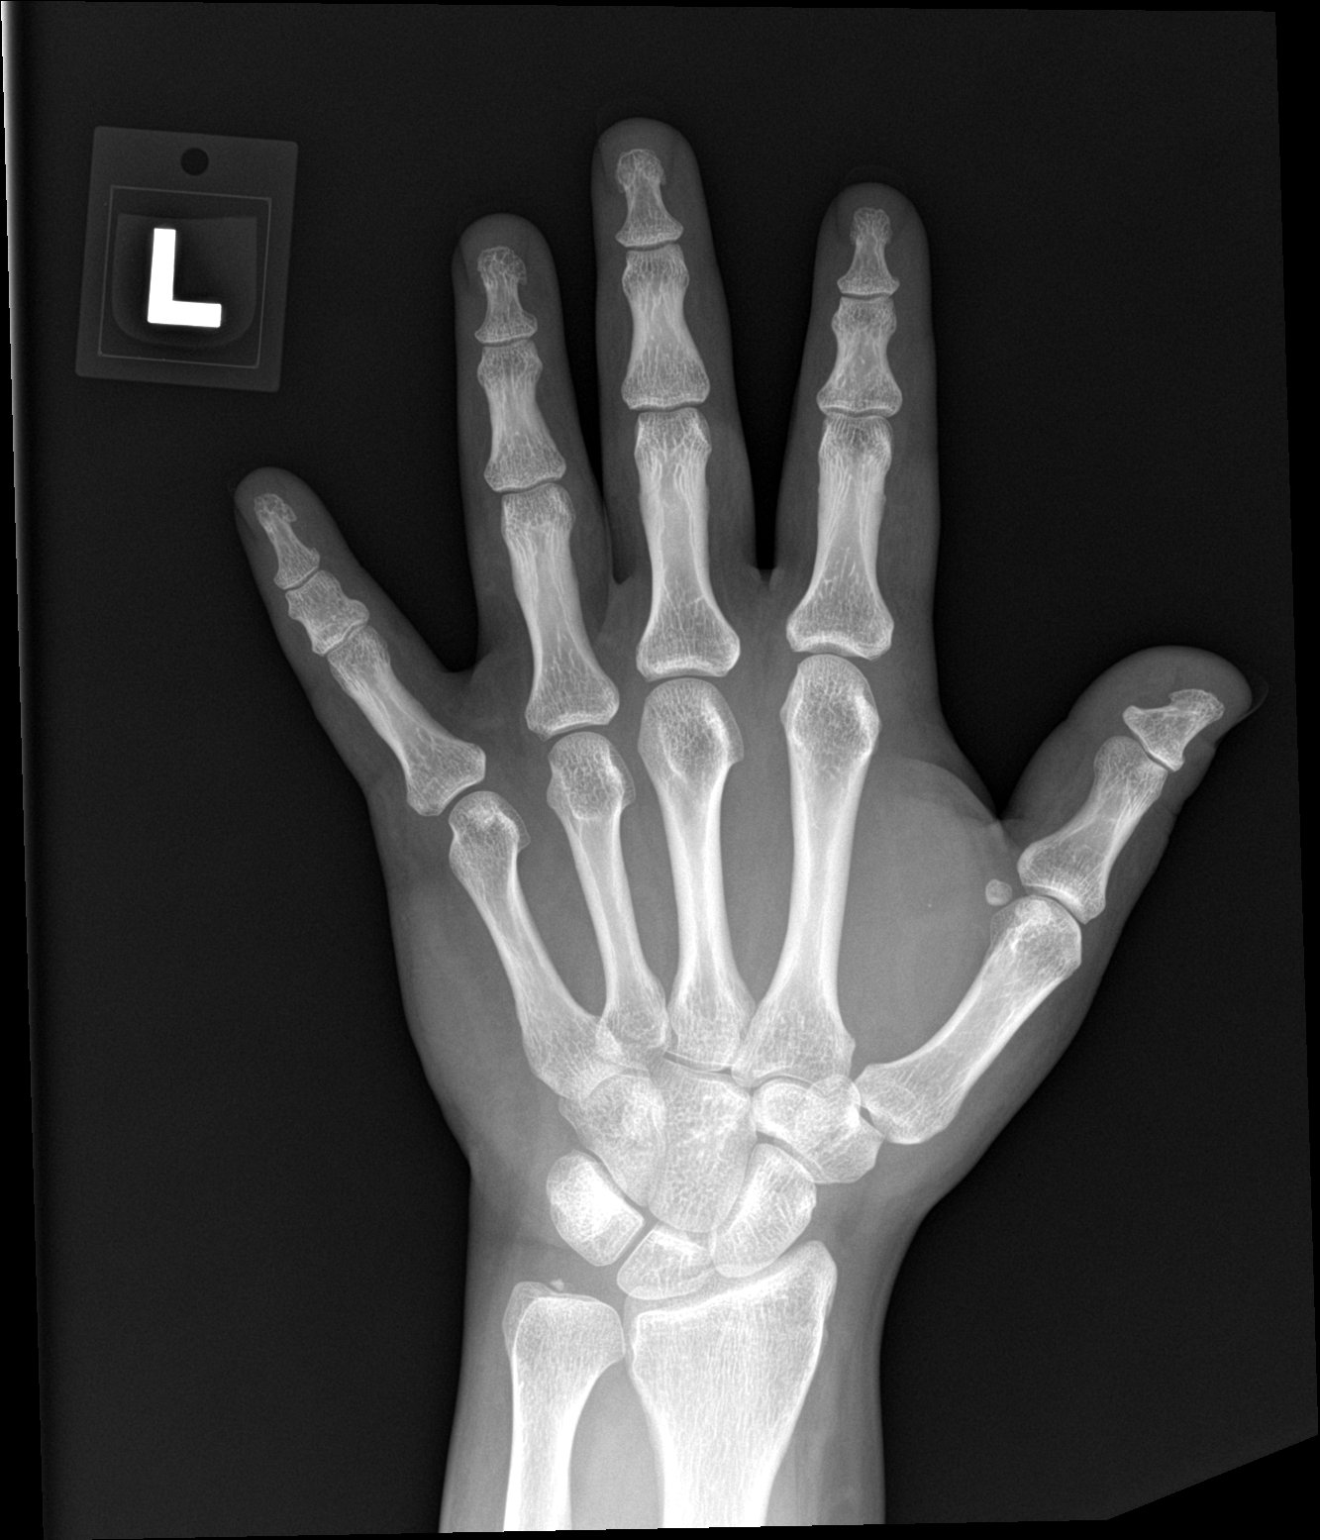

[hand obl]
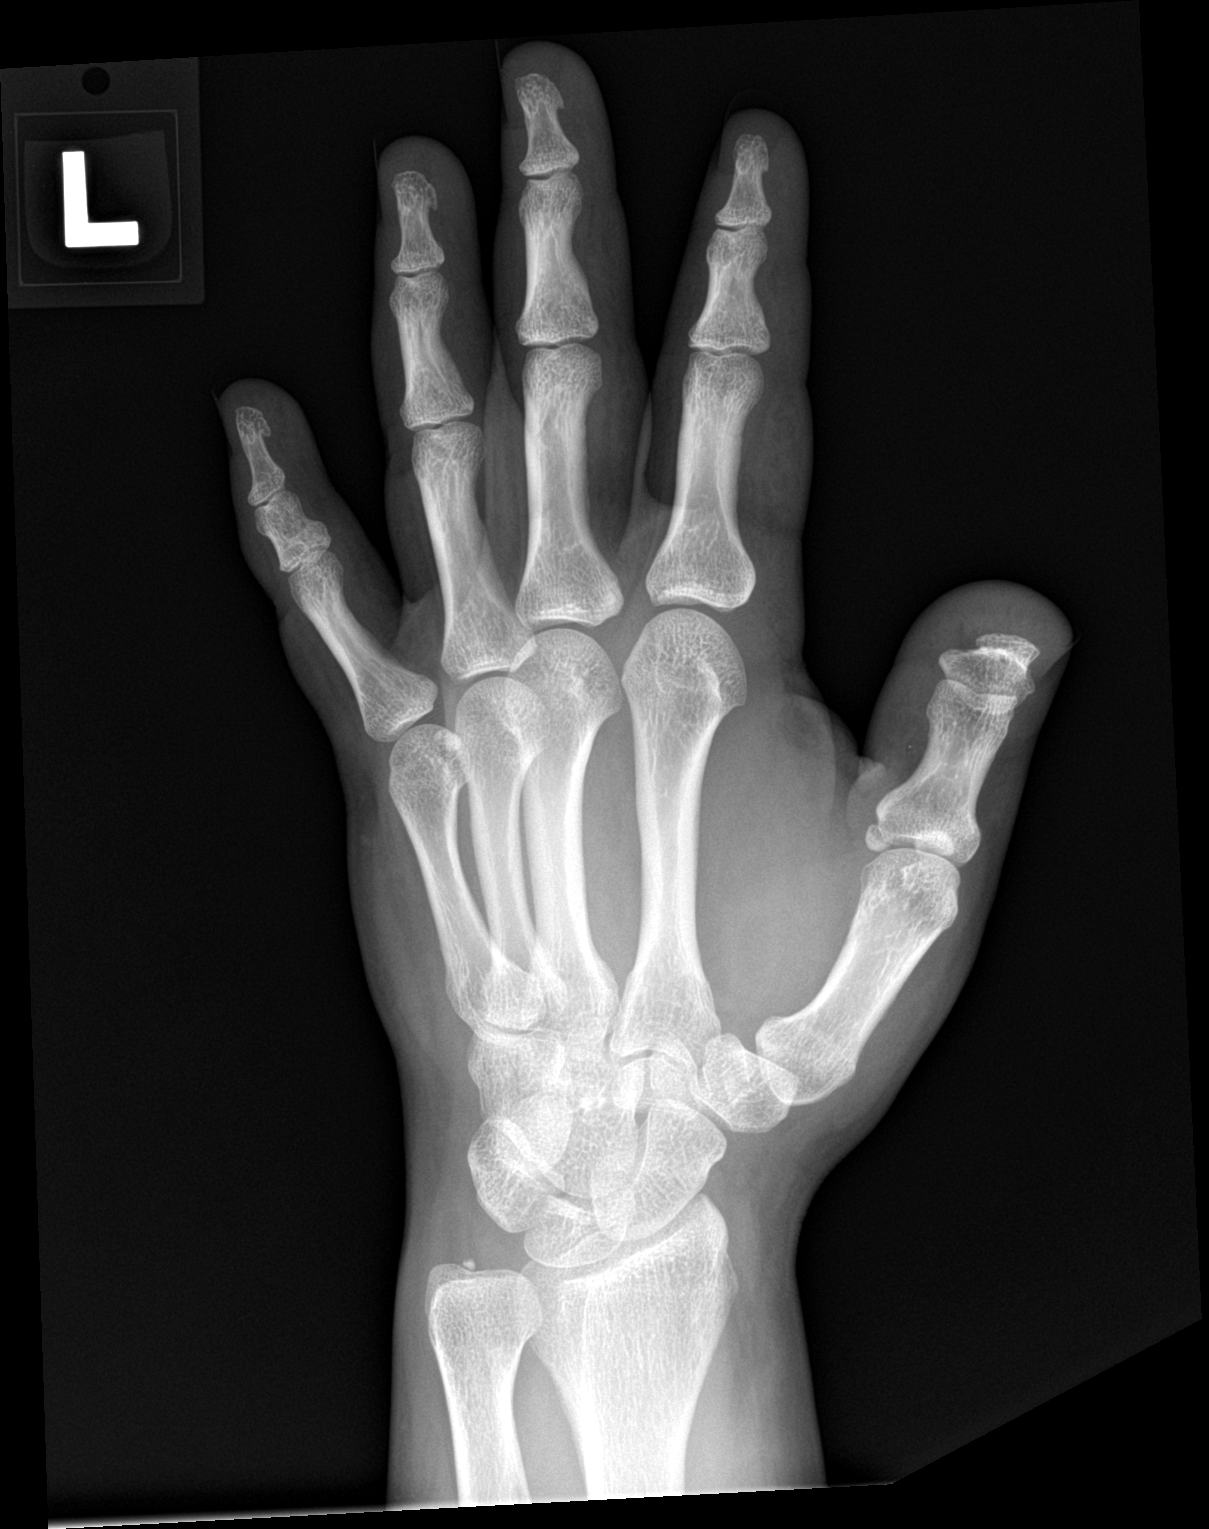

[hand lat]
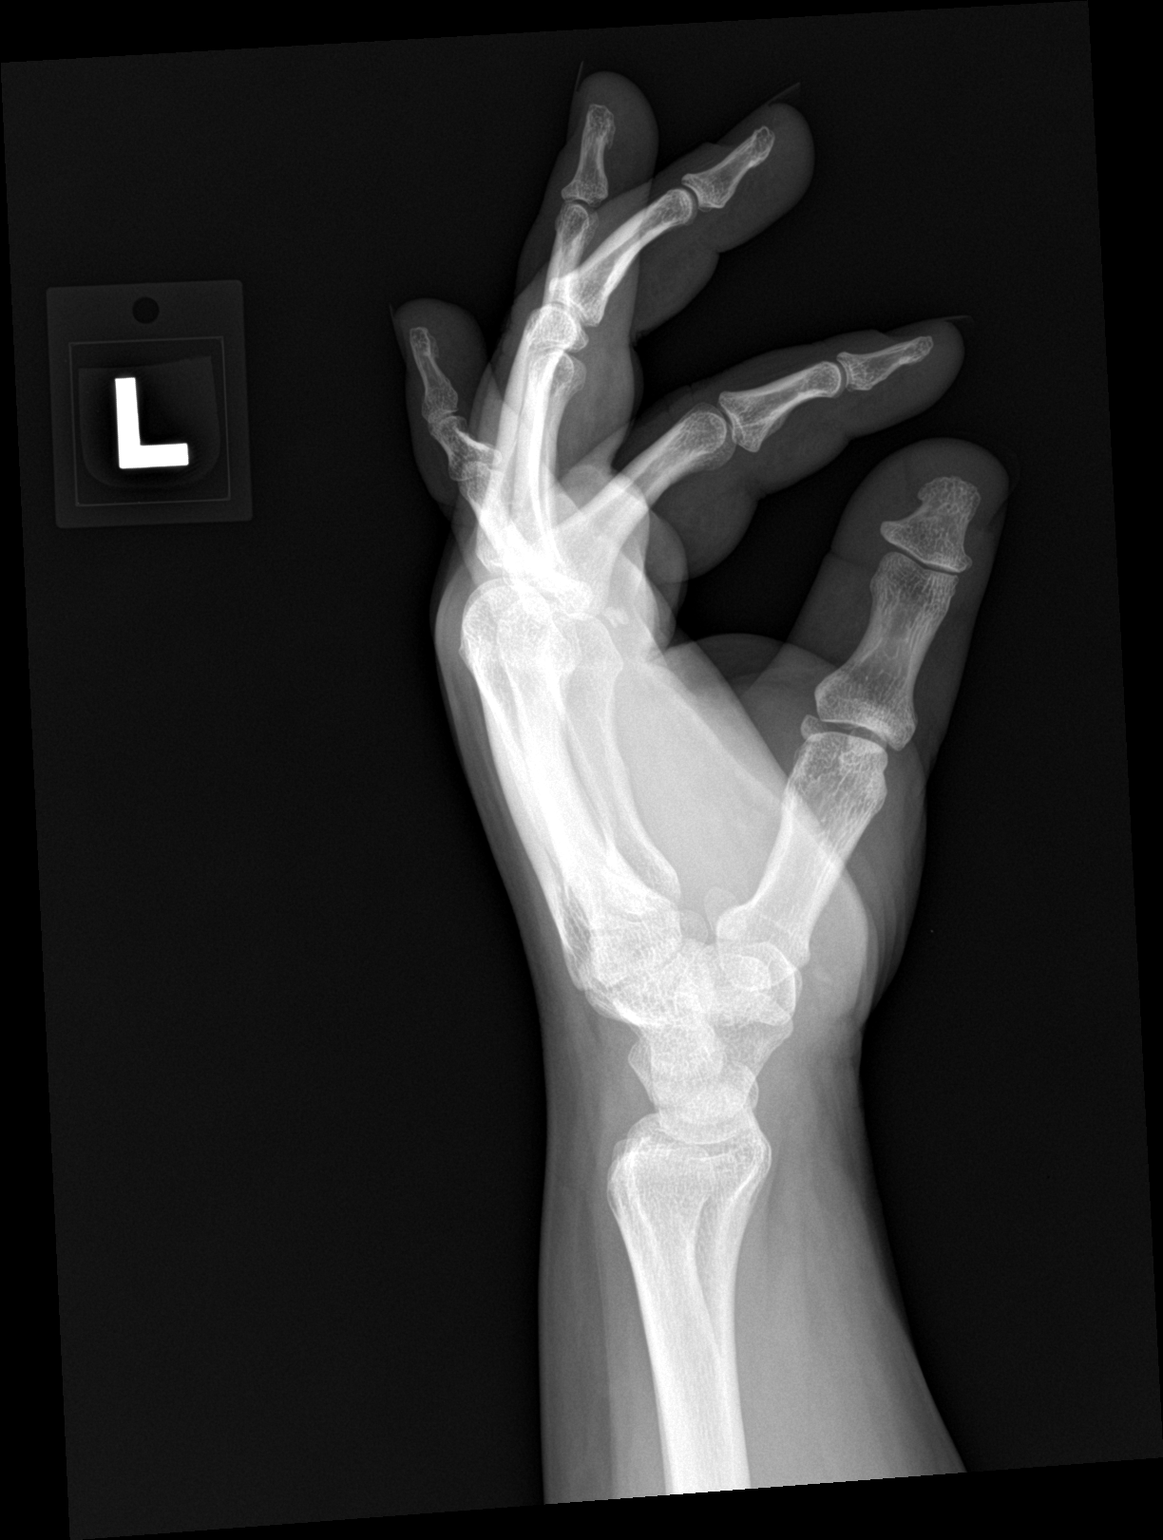

[3 of 3 positions shown; findings below may reference images not displayed]

FINDINGS: There is no evidence of fracture or dislocation. There is no
evidence of arthropathy or other focal bone abnormality. Soft
tissues are unremarkable.
IMPRESSION: Negative.

## 2022-07-15 ENCOUNTER — Ambulatory Visit: Admit: 2022-07-15 | Disposition: A | Discharge status: Home / Self Care | Payer: BC Managed Care – PPO

## 2022-07-17 DIAGNOSIS — R109 Unspecified abdominal pain: Secondary | ICD-10-CM | POA: Diagnosis not present

## 2022-07-17 DIAGNOSIS — N10 Acute tubulo-interstitial nephritis: Secondary | ICD-10-CM | POA: Diagnosis not present

## 2022-07-17 DIAGNOSIS — R1031 Right lower quadrant pain: Secondary | ICD-10-CM | POA: Diagnosis not present

## 2022-07-17 DIAGNOSIS — M545 Low back pain, unspecified: Secondary | ICD-10-CM | POA: Diagnosis not present

## 2022-10-31 ENCOUNTER — Encounter: Payer: Self-pay | Admitting: Physician Assistant

## 2022-10-31 ENCOUNTER — Ambulatory Visit (INDEPENDENT_AMBULATORY_CARE_PROVIDER_SITE_OTHER): Payer: BC Managed Care – PPO | Admitting: Physician Assistant

## 2022-10-31 VITALS — BP 123/86 | HR 80 | Temp 97.6°F | Ht 64.0 in | Wt 205.0 lb

## 2022-10-31 DIAGNOSIS — H903 Sensorineural hearing loss, bilateral: Secondary | ICD-10-CM

## 2022-10-31 DIAGNOSIS — E785 Hyperlipidemia, unspecified: Secondary | ICD-10-CM | POA: Diagnosis not present

## 2022-10-31 DIAGNOSIS — Z86018 Personal history of other benign neoplasm: Secondary | ICD-10-CM | POA: Diagnosis not present

## 2022-10-31 DIAGNOSIS — F411 Generalized anxiety disorder: Secondary | ICD-10-CM

## 2022-10-31 DIAGNOSIS — Z1159 Encounter for screening for other viral diseases: Secondary | ICD-10-CM

## 2022-10-31 DIAGNOSIS — Z114 Encounter for screening for human immunodeficiency virus [HIV]: Secondary | ICD-10-CM

## 2022-10-31 DIAGNOSIS — D234 Other benign neoplasm of skin of scalp and neck: Secondary | ICD-10-CM

## 2022-10-31 DIAGNOSIS — Z0000 Encounter for general adult medical examination without abnormal findings: Secondary | ICD-10-CM

## 2022-10-31 NOTE — Assessment & Plan Note (Signed)
S/p myomectomy has IUD in Refer to gyn for cervical cancer screening
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
## 2022-10-31 NOTE — Assessment & Plan Note (Signed)
Historically will repeat fasting lipids 

## 2022-10-31 NOTE — Assessment & Plan Note (Signed)
Pt feels well controlled without meds, therapy  Will monitor

## 2022-10-31 NOTE — Assessment & Plan Note (Signed)
Ref to derm for removal
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
## 2022-11-01 LAB — CBC WITH DIFFERENTIAL/PLATELET
Basophils Absolute: 0 10*3/uL (ref 0.0–0.2)
Basos: 1 %
EOS (ABSOLUTE): 0.2 10*3/uL (ref 0.0–0.4)
Eos: 4 %
Hematocrit: 43.3 % (ref 34.0–46.6)
Hemoglobin: 14.7 g/dL (ref 11.1–15.9)
Immature Grans (Abs): 0 10*3/uL (ref 0.0–0.1)
Immature Granulocytes: 1 %
Lymphocytes Absolute: 1.2 10*3/uL (ref 0.7–3.1)
Lymphs: 21 %
MCH: 32 pg (ref 26.6–33.0)
MCHC: 33.9 g/dL (ref 31.5–35.7)
MCV: 94 fL (ref 79–97)
Monocytes Absolute: 0.6 10*3/uL (ref 0.1–0.9)
Monocytes: 11 %
Neutrophils Absolute: 3.6 10*3/uL (ref 1.4–7.0)
Neutrophils: 62 %
Platelets: 294 10*3/uL (ref 150–450)
RBC: 4.6 x10E6/uL (ref 3.77–5.28)
RDW: 11.9 % (ref 11.7–15.4)
WBC: 5.7 10*3/uL (ref 3.4–10.8)

## 2022-11-01 LAB — HIV ANTIBODY (ROUTINE TESTING W REFLEX): HIV Screen 4th Generation wRfx: NONREACTIVE

## 2022-11-01 LAB — COMPREHENSIVE METABOLIC PANEL
ALT: 20 IU/L (ref 0–32)
AST: 18 IU/L (ref 0–40)
Albumin/Globulin Ratio: 2 (ref 1.2–2.2)
Albumin: 4.5 g/dL (ref 3.9–4.9)
Alkaline Phosphatase: 52 IU/L (ref 44–121)
BUN/Creatinine Ratio: 15 (ref 9–23)
BUN: 11 mg/dL (ref 6–20)
Bilirubin Total: 0.5 mg/dL (ref 0.0–1.2)
CO2: 21 mmol/L (ref 20–29)
Calcium: 10.1 mg/dL (ref 8.7–10.2)
Chloride: 101 mmol/L (ref 96–106)
Creatinine, Ser: 0.74 mg/dL (ref 0.57–1.00)
Globulin, Total: 2.3 g/dL (ref 1.5–4.5)
Glucose: 97 mg/dL (ref 70–99)
Potassium: 4.7 mmol/L (ref 3.5–5.2)
Sodium: 138 mmol/L (ref 134–144)
Total Protein: 6.8 g/dL (ref 6.0–8.5)
eGFR: 107 mL/min/{1.73_m2} (ref >59)

## 2022-11-01 LAB — HEMOGLOBIN A1C
Est. average glucose Bld gHb Est-mCnc: 103 mg/dL
Hgb A1c MFr Bld: 5.2 % (ref 4.8–5.6)

## 2022-11-01 LAB — LIPID PANEL
Chol/HDL Ratio: 3.1 ratio (ref 0.0–4.4)
Cholesterol, Total: 209 mg/dL — ABNORMAL HIGH (ref 100–199)
HDL: 68 mg/dL (ref >39)
LDL Chol Calc (NIH): 115 mg/dL — ABNORMAL HIGH (ref 0–99)
Triglycerides: 152 mg/dL — ABNORMAL HIGH (ref 0–149)
VLDL Cholesterol Cal: 26 mg/dL (ref 5–40)

## 2022-11-01 LAB — TSH: TSH: 1.17 u[IU]/mL (ref 0.450–4.500)

## 2022-11-01 LAB — HEPATITIS C ANTIBODY: Hep C Virus Ab: NONREACTIVE

## 2022-11-22 ENCOUNTER — Ambulatory Visit: Payer: Self-pay | Admitting: Physician Assistant

## 2022-11-26 DIAGNOSIS — L720 Epidermal cyst: Secondary | ICD-10-CM | POA: Diagnosis not present

## 2022-11-26 DIAGNOSIS — L02811 Cutaneous abscess of head [any part, except face]: Secondary | ICD-10-CM | POA: Diagnosis not present

## 2022-12-10 DIAGNOSIS — L02811 Cutaneous abscess of head [any part, except face]: Secondary | ICD-10-CM | POA: Diagnosis not present

## 2022-12-13 ENCOUNTER — Encounter: Payer: Self-pay | Admitting: Physician Assistant

## 2022-12-17 NOTE — Progress Notes (Deleted)
Nichole Ferguson, PA-C   No chief complaint on file.   HPI:      Nichole Cain is a 37 y.o. No obstetric history on file. whose LMP was No LMP recorded. (Menstrual status: IUD)., presents today for NP pap smear, referred by PCP.   She reports h/o of uterine fibroids, dysmenorrhea, s/p myomectomy 3 years ago, has IUD placed. Would like a referral to GY.   Patient Active Problem List   Diagnosis Date Noted   History of uterine fibroid 10/31/2022   Dermoid cyst of scalp 10/31/2022   GAD (generalized anxiety disorder) 01/14/2020   Panic attack 01/14/2020   Sensorineural hearing loss (SNHL) of both ears 10/22/2019   Submucous myoma of uterus 09/01/2019   Fibroids 07/16/2019   Hyperlipidemia 07/16/2019   Bilateral hearing loss 01/14/2019   Allergic rhinitis 01/14/2019    Past Surgical History:  Procedure Laterality Date   INNER EAR SURGERY     tubes x 9 and skin grafts in ear drums   MYOMECTOMY      Family History  Problem Relation Age of Onset   Anxiety disorder Mother    Depression Mother    Heart disease Mother        MI   Hyperlipidemia Mother    Hypertension Mother    Heart disease Maternal Grandfather        CHF   Cancer Paternal Grandfather        testicular cancer    Social History   Socioeconomic History   Marital status: Single    Spouse name: Not on file   Number of children: Not on file   Years of education: Not on file   Highest education level: Not on file  Occupational History   Not on file  Tobacco Use   Smoking status: Every Day    Packs/day: 0.50    Years: 17.00    Additional pack years: 0.00    Total pack years: 8.50    Types: Cigarettes   Smokeless tobacco: Never   Tobacco comments:    smoker since 71s to 2017 10+ years max 1 pk lasting 2 days to 1 ppd   Substance and Sexual Activity   Alcohol use: Yes    Comment: occasionally    Drug use: Not Currently   Sexual activity: Yes  Other Topics Concern   Not on file  Social  History 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    Single    Mom is Melbourne Abts    Former smoker from 20s x 10 + years max 1 pk in 2 days to 1 ppd    Born in [place] lived in [place] but moved back to [place]    Social Determinants of Corporate investment banker Strain: Not on file  Food Insecurity: Not on file  Transportation Needs: Not on file  Physical Activity: Not on file  Stress: Not on file  Social Connections: Not on file  Intimate Partner Violence: Not on file    Outpatient Medications Prior to Visit  Medication Sig Dispense Refill   cetirizine (ZYRTEC) 10 MG tablet Take 10 mg by mouth daily as needed for allergies.     fluticasone (FLONASE) 50 MCG/ACT nasal spray Place into both nostrils daily.     levonorgestrel (MIRENA) 20 MCG/24HR IUD 1 each by Intrauterine route once.     No facility-administered medications prior to visit.      ROS:  Review of Systems BREAST: No symptoms   OBJECTIVE:   Vitals:  There were no vitals taken for this visit.  Physical Exam  Results: No results found for this or any previous visit (from the past 24 hour(s)).   Assessment/Plan: No diagnosis found.    No orders of the defined types were placed in this encounter.     No follow-ups on file.  Bellami Farrelly B. Valeska Haislip, PA-C 12/17/2022 5:12 PM

## 2022-12-18 ENCOUNTER — Encounter: Payer: BC Managed Care – PPO | Admitting: Obstetrics and Gynecology

## 2022-12-18 DIAGNOSIS — Z124 Encounter for screening for malignant neoplasm of cervix: Secondary | ICD-10-CM

## 2022-12-18 DIAGNOSIS — Z1151 Encounter for screening for human papillomavirus (HPV): Secondary | ICD-10-CM

## 2022-12-18 DIAGNOSIS — Z30431 Encounter for routine checking of intrauterine contraceptive device: Secondary | ICD-10-CM

## 2022-12-19 DIAGNOSIS — J069 Acute upper respiratory infection, unspecified: Secondary | ICD-10-CM | POA: Diagnosis not present

## 2022-12-19 NOTE — Telephone Encounter (Signed)
Please advise 

## 2022-12-26 ENCOUNTER — Encounter: Payer: Self-pay | Admitting: Obstetrics and Gynecology

## 2023-04-09 DIAGNOSIS — R059 Cough, unspecified: Secondary | ICD-10-CM | POA: Diagnosis not present

## 2023-08-22 DIAGNOSIS — L0201 Cutaneous abscess of face: Secondary | ICD-10-CM | POA: Diagnosis not present

## 2024-01-02 NOTE — Progress Notes (Addendum)
 PCP:  Trenton Frock, PA-C   Chief Complaint  Patient presents with   New Gyn    Pap smear, usually no cycles with IUD, started bleeding a month ago. Spotting most days and 5/6 times with heavy flow. Sharp pain and pelvic pressure.     HPI:      [name] is a 38 y.o. G1P0010 whose LMP was No LMP recorded. (Menstrual status: IUD)., presents today for NP pap smear and IUD management. Last pap about 2020 that was normal; no hx of abn paps per pt. Getting notes sent to our practice.   Mirena  placed ~2021 for menorrhagia. Hx of leio, s/p myomectomy in 2020. (Saw [facility] 2020 who recommended hyst and pt didn't want that. Went to practice in [place], but her GYN has recently moved.) Has been amenorrheic with IUD with occas, rare spotting only since placement. Has had 5-6 days mod bleeding, changing products Q2 hrs with stringy clots, in past 1 1/2-2 months, and then spotting occas. Had what was like a real period 5/17-21/25. Having random, sharp, fleeing suprapubic pains in past 1 1/2 -2 months, as well as LBP. No dysmen. Given hx of leio and IUD, pt wants to get eval. Has had diarrhea past 1-2 wks that resolved, but no relation to pelvic pain. No urin/vag sx.  Not been sexually active in past 2 yrs.  No FH breast/ovar/uterine cancer  Patient Active Problem List   Diagnosis Date Noted   History of uterine fibroid 10/31/2022   Dermoid cyst of scalp 10/31/2022   GAD (generalized anxiety disorder) 01/14/2020   Panic attack 01/14/2020   Sensorineural hearing loss (SNHL) of both ears 10/22/2019   Submucous myoma of uterus 09/01/2019   Leiomyoma 07/16/2019   Hyperlipidemia 07/16/2019   Bilateral hearing loss 01/14/2019   Allergic rhinitis 01/14/2019    Past Surgical History:  Procedure Laterality Date   INNER EAR SURGERY     tubes x 9 and skin grafts in ear drums   MYOMECTOMY      Family History  Problem Relation Age of Onset   Anxiety disorder Mother    Depression Mother     Heart disease Mother        MI   Hyperlipidemia Mother    Hypertension Mother    Heart disease Maternal Grandfather        CHF   Cancer Paternal Grandfather        testicular cancer    Social History   Socioeconomic History   Marital status: Single    Spouse name: Not on file   Number of children: Not on file   Years of education: Not on file   Highest education level: Not on file  Occupational History   Not on file  Tobacco Use   Smoking status: Former    Current packs/day: 0.50    Average packs/day: 0.5 packs/day for 17.0 years (8.5 ttl pk-yrs)    Types: Cigarettes   Smokeless tobacco: Never   Tobacco comments:    smoker since 20s to 2017 10+ years max 1 pk lasting 2 days to 1 ppd   Vaping Use   Vaping status: Former  Substance and Sexual Activity   Alcohol use: Yes    Comment: occasionally    Drug use: Not Currently   Sexual activity: Not Currently    Birth control/protection: I.U.D.    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    No kids    Single    Mom is Algis Anton    Former smoker from 20s x 10 + years max 1 pk in 2 days to 1 ppd    Born in [place] lived in [place] but moved back to [place]    Social Drivers of Corporate investment banker Strain: Not on [employer] Insecurity: Not on file  Transportation Needs: Not on file  Physical Activity: Not on file  Stress: Not on file  Social Connections: Unknown (12/26/2021)   Received from [employer], [facility]   Social Network    Social Network: Not on file  Intimate Partner Violence: Unknown (11/17/2021)   Received from [facility], [facility]   HITS    Physically Hurt: Not on file    Insult or Talk Down To: Not on file    Threaten Physical Harm: Not on file    Scream or Curse: Not on file     Current Outpatient Medications:    cetirizine (ZYRTEC) 10 MG tablet, Take 10 mg by mouth daily as needed for allergies., Disp: ,  Rfl:    fluticasone (FLONASE) 50 MCG/ACT nasal spray, Place into both nostrils daily., Disp: , Rfl:    levonorgestrel  (MIRENA ) 20 MCG/24HR IUD, 1 each by Intrauterine route once., Disp: , Rfl:      ROS:  Review of Systems  Constitutional:  Negative for fever.  Gastrointestinal:  Negative for blood in stool, constipation, diarrhea, nausea and vomiting.  Genitourinary:  Positive for menstrual problem and pelvic pain. Negative for dyspareunia, dysuria, flank pain, frequency, hematuria, urgency, vaginal bleeding, vaginal discharge and vaginal pain.  Musculoskeletal:  Positive for back pain.  Skin:  Negative for rash.   BREAST: No symptoms   Objective: BP 124/82   Pulse 93   Ht 5\' 4"  (1.626 m)   Wt 208 lb (94.3 kg)   BMI 35.70 kg/m    Physical Exam Constitutional:      General: She is not in acute distress. Genitourinary:     Vulva normal.     Genitourinary Comments: IUD STRINGS NOT IN CX OS     Right Labia: No rash, tenderness or lesions.    Left Labia: No tenderness, lesions or rash.    No vaginal discharge, erythema, tenderness or bleeding.      Right Adnexa: not tender and no mass present.    Left Adnexa: not tender and no mass present.    No cervical motion tenderness or friability.     No IUD strings visualized.     Uterus is enlarged.     Uterus is not tender.     Uterine mass present. Pulmonary:     Effort: Pulmonary effort is normal.  Musculoskeletal:        General: Normal range of motion.  Neurological:     General: No focal deficit present.     Mental Status: She is alert.     Cranial Nerves: No cranial nerve deficit.  Skin:    General: Skin is warm and dry.  Psychiatric:        Mood and Affect: Mood normal.        Behavior: Behavior normal.        Thought Content: Thought content normal.        Judgment: Judgment normal.  Vitals and nursing note reviewed.     Assessment/Plan: Abnormal uterine bleeding (AUB) - Plan: US  PELVIS TRANSVAGINAL NON-OB  (TV  ONLY); with pelvic pains; IUD strings not in cx os. Check IUD placement as well as leio with u/s. Will f/u with results and mgmt options. If WNL, discussed replacing IUD early.   Leiomyoma - Plan: US  PELVIS TRANSVAGINAL NON-OB (TV ONLY); check with GYN u/s.   Encounter for routine checking of intrauterine contraceptive device (IUD) - Plan: US  PELVIS TRANSVAGINAL NON-OB (TV ONLY)  Cervical cancer screening - Plan: Cytology - PAP  Screening for HPV (human papillomavirus) - Plan: Cytology - PAP             F/U  Return for GYN u/s next wk for IUD placement/AUB.  Sheva Mcdougle B. Bentleigh Stankus, PA-C 01/07/2024 11:56 AM

## 2024-01-07 ENCOUNTER — Ambulatory Visit (INDEPENDENT_AMBULATORY_CARE_PROVIDER_SITE_OTHER): Admitting: Obstetrics and Gynecology

## 2024-01-07 ENCOUNTER — Encounter: Payer: Self-pay | Admitting: Obstetrics and Gynecology

## 2024-01-07 ENCOUNTER — Other Ambulatory Visit (HOSPITAL_COMMUNITY)
Admission: RE | Admit: 2024-01-07 | Discharge: 2024-01-07 | Disposition: A | Discharge status: Home / Self Care | Source: Ambulatory Visit | Attending: Obstetrics and Gynecology | Admitting: Obstetrics and Gynecology

## 2024-01-07 VITALS — BP 124/82 | HR 93 | Ht 64.0 in | Wt 208.0 lb

## 2024-01-07 DIAGNOSIS — Z30431 Encounter for routine checking of intrauterine contraceptive device: Secondary | ICD-10-CM

## 2024-01-07 DIAGNOSIS — Z1151 Encounter for screening for human papillomavirus (HPV): Secondary | ICD-10-CM | POA: Insufficient documentation

## 2024-01-07 DIAGNOSIS — D219 Benign neoplasm of connective and other soft tissue, unspecified: Secondary | ICD-10-CM

## 2024-01-07 DIAGNOSIS — D259 Leiomyoma of uterus, unspecified: Secondary | ICD-10-CM

## 2024-01-07 DIAGNOSIS — N939 Abnormal uterine and vaginal bleeding, unspecified: Secondary | ICD-10-CM | POA: Diagnosis not present

## 2024-01-07 DIAGNOSIS — Z124 Encounter for screening for malignant neoplasm of cervix: Secondary | ICD-10-CM | POA: Insufficient documentation

## 2024-01-07 NOTE — Patient Instructions (Signed)
 I value your feedback and you entrusting [place] with your care. If you get a [place] patient survey, I would appreciate you taking the time to let us know about your experience today. Thank you! ? ? ?

## 2024-01-09 LAB — CYTOLOGY - PAP
Comment: NEGATIVE
Diagnosis: NEGATIVE
High risk HPV: NEGATIVE

## 2024-01-16 ENCOUNTER — Telehealth: Payer: Self-pay | Admitting: Obstetrics and Gynecology

## 2024-01-16 ENCOUNTER — Ambulatory Visit

## 2024-01-16 DIAGNOSIS — D219 Benign neoplasm of connective and other soft tissue, unspecified: Secondary | ICD-10-CM

## 2024-01-16 DIAGNOSIS — D259 Leiomyoma of uterus, unspecified: Secondary | ICD-10-CM

## 2024-01-16 DIAGNOSIS — N939 Abnormal uterine and vaginal bleeding, unspecified: Secondary | ICD-10-CM | POA: Diagnosis not present

## 2024-01-16 DIAGNOSIS — Z30431 Encounter for routine checking of intrauterine contraceptive device: Secondary | ICD-10-CM

## 2024-01-16 NOTE — Telephone Encounter (Signed)
 [facility] u/s results with pt. Has several leio, already did myomectomy in 2020. Pt no longer trying to preserve fertility. Has Mirena  but now malpositioned due to size of leio. Pt to RTO 01/27/24 for IUD removal, causing pain. Discussed tx options of hormones, UFE, myomectomy and hyst. Pt would like hyst. RTO with Dr. Luster Cain for consult. Pt is not sexually active.

## 2024-01-27 ENCOUNTER — Ambulatory Visit (INDEPENDENT_AMBULATORY_CARE_PROVIDER_SITE_OTHER): Admitting: Obstetrics and Gynecology

## 2024-01-27 ENCOUNTER — Encounter: Payer: Self-pay | Admitting: Obstetrics and Gynecology

## 2024-01-27 VITALS — BP 109/72 | HR 90 | Ht 64.0 in | Wt 211.0 lb

## 2024-01-27 DIAGNOSIS — T8332XA Displacement of intrauterine contraceptive device, initial encounter: Secondary | ICD-10-CM | POA: Diagnosis not present

## 2024-01-27 DIAGNOSIS — T8332XD Displacement of intrauterine contraceptive device, subsequent encounter: Secondary | ICD-10-CM

## 2024-01-27 DIAGNOSIS — Z538 Procedure and treatment not carried out for other reasons: Secondary | ICD-10-CM | POA: Diagnosis not present

## 2024-01-27 DIAGNOSIS — Z30432 Encounter for removal of intrauterine contraceptive device: Secondary | ICD-10-CM

## 2024-01-27 NOTE — Progress Notes (Signed)
   Chief Complaint  Patient presents with   IUD Removal     History of Present Illness:  Nichole Cain is a 38 y.o. that had a Mirena  IUD placed in 2021 for menorrhagia, hx of leio/myomectomy. Was amenorrheic but now with pain/AUB. GYN u/s 6/25 showed IUD malpositioned. Pt here for IUD removal due to AUB, but no bleeding for the past 10 days. Would like hyst and has consult with Dr. Luster Salters 02/18/24.    BP 109/72   Pulse 90   Ht 5' 4 (1.626 m)   Wt 211 lb (95.7 kg)   BMI 36.22 kg/m   Pelvic exam:  Two IUD strings absent seen from the cervical os. EGBUS, vaginal vault and cervix: within normal limits  IUD Removal Strings of IUD NOT identified or grasped with IUD hook and Boseman forceps. Unable to retrieve IUD.   Assessment/Plan:  Malpositioned intrauterine device (IUD), subsequent encounter--pt's bleeding stopped for past 1 1/2 wks. Has hyst consult in a couple wks. Will see if Dr. Luster Salters can remove at appt vs hyst anyway. F/u prn bleeding for Rx aygestin in meantime.   Attempted IUD removal, unsuccessful  Encounter for IUD removal  Return if symptoms worsen or fail to improve.   Kofi Murrell B. Lorrin Nawrot, PA-C 01/27/2024 4:41 PM

## 2024-01-27 NOTE — Patient Instructions (Signed)
 I value your feedback and you entrusting [place] with your care. If you get a [place] patient survey, I would appreciate you taking the time to let us know about your experience today. Thank you! ? ? ?

## 2024-01-28 ENCOUNTER — Other Ambulatory Visit: Payer: Self-pay | Admitting: Obstetrics and Gynecology

## 2024-01-28 ENCOUNTER — Encounter: Payer: Self-pay | Admitting: Obstetrics and Gynecology

## 2024-01-28 DIAGNOSIS — N939 Abnormal uterine and vaginal bleeding, unspecified: Secondary | ICD-10-CM

## 2024-01-28 MED ORDER — NORETHINDRONE ACETATE 5 MG PO TABS
5.0000 mg | ORAL_TABLET | Freq: Every day | ORAL | 0 refills | Status: DC
Start: 2024-01-28 — End: 2024-03-11

## 2024-01-28 NOTE — Progress Notes (Signed)
 Rx aygestin for bleeding with leio/IUD. Couldn't remove yesterday, has appt for hyst consult with DR. Evans 02/18/24.

## 2024-02-18 ENCOUNTER — Encounter: Payer: Self-pay | Admitting: Obstetrics and Gynecology

## 2024-02-18 ENCOUNTER — Ambulatory Visit: Admitting: Obstetrics and Gynecology

## 2024-02-18 VITALS — BP 131/88 | HR 93 | Ht 64.0 in | Wt 211.4 lb

## 2024-02-18 DIAGNOSIS — R102 Pelvic and perineal pain: Secondary | ICD-10-CM | POA: Diagnosis not present

## 2024-02-18 DIAGNOSIS — D259 Leiomyoma of uterus, unspecified: Secondary | ICD-10-CM | POA: Diagnosis not present

## 2024-02-18 DIAGNOSIS — N939 Abnormal uterine and vaginal bleeding, unspecified: Secondary | ICD-10-CM | POA: Diagnosis not present

## 2024-02-18 DIAGNOSIS — T8332XD Displacement of intrauterine contraceptive device, subsequent encounter: Secondary | ICD-10-CM | POA: Diagnosis not present

## 2024-02-18 DIAGNOSIS — D219 Benign neoplasm of connective and other soft tissue, unspecified: Secondary | ICD-10-CM

## 2024-02-18 DIAGNOSIS — Z01818 Encounter for other preprocedural examination: Secondary | ICD-10-CM

## 2024-02-18 NOTE — Progress Notes (Signed)
 HPI:      [name] is a 38 y.o. G1P0010 who LMP was No LMP recorded (lmp unknown). (Menstrual status: IUD).  Subjective:   She presents today because she has been having irregular bleeding and pain.  She has multiple uterine fibroids.  She also has a Mirena  IUD in place which was working for several years.  A recent ultrasound shows multiple uterine fibroids and that her IUD is positioned lower than expected in the endometrium.  She feels as if she is being poked by the IUD on a daily basis.  The IUD strings are not visible at the cervical os per history.  She is specifically requesting hysterectomy at this time. Of significant note, patient previously had a submucosal fibroid which was resected hysteroscopically. She has decided that she no longer desires fertility.    Hx: The following portions of the patient's history were reviewed and updated as appropriate:             She  has a past medical history of Allergy, Anxiety, Hearing loss, and Sinusitis. She does not have any pertinent problems on file. She  has a past surgical history that includes Inner ear surgery and Myomectomy. Her family history includes Anxiety disorder in her mother; Cancer in her paternal grandfather; Depression in her mother; Heart disease in her maternal grandfather and mother; Hyperlipidemia in her mother; Hypertension in her mother. She  reports that she has quit smoking. Her smoking use included cigarettes. She has a 8.5 pack-year smoking history. She has never used smokeless tobacco. She reports current alcohol use. She reports that she does not currently use drugs. She has a current medication list which includes the following prescription(s): cetirizine, fluticasone, levonorgestrel , and norethindrone . She has no known allergies.       Review of Systems:  Review of Systems  Constitutional: Denied constitutional symptoms, night sweats, recent illness, fatigue, fever, insomnia and weight loss.  Eyes:  Denied eye symptoms, eye pain, photophobia, vision change and visual disturbance.  Ears/Nose/Throat/Neck: Denied ear, nose, throat or neck symptoms, hearing loss, nasal discharge, sinus congestion and sore throat.  Cardiovascular: Denied cardiovascular symptoms, arrhythmia, chest pain/pressure, edema, exercise intolerance, orthopnea and palpitations.  Respiratory: Denied pulmonary symptoms, asthma, pleuritic pain, productive sputum, cough, dyspnea and wheezing.  Gastrointestinal: Denied, gastro-esophageal reflux, melena, nausea and vomiting.  Genitourinary: See HPI for additional information.  Musculoskeletal: Denied musculoskeletal symptoms, stiffness, swelling, muscle weakness and myalgia.  Dermatologic: Denied dermatology symptoms, rash and scar.  Neurologic: Denied neurology symptoms, dizziness, headache, neck pain and syncope.  Psychiatric: Denied psychiatric symptoms, anxiety and depression.  Endocrine: Denied endocrine symptoms including hot flashes and night sweats.   Meds:   Current Outpatient Medications on File Prior to Visit  Medication Sig Dispense Refill   cetirizine (ZYRTEC) 10 MG tablet Take 10 mg by mouth daily as needed for allergies.     fluticasone (FLONASE) 50 MCG/ACT nasal spray Place into both nostrils daily.     levonorgestrel  (MIRENA ) 20 MCG/24HR IUD 1 each by Intrauterine route once.     norethindrone  (AYGESTIN ) 5 MG tablet Take 1 tablet (5 mg total) by mouth daily. 90 tablet 0   No current facility-administered medications on file prior to visit.      Objective:     Vitals:   02/18/24 1108  BP: 131/88  Pulse: 93   Filed Weights   02/18/24 1108  Weight: 211 lb 6.4 oz (95.9 kg)  Physical examination   Pelvic:   Vulva: Normal appearance.  No lesions.  Vagina: No lesions or abnormalities noted.  Support: Normal pelvic support.  Urethra No masses tenderness or scarring.  Meatus Normal size without lesions or prolapse.  Cervix: Normal  appearance.  No lesions.  Anus: Normal exam.  No lesions.  Perineum: Normal exam.  No lesions.        Bimanual   Uterus: 12-14wks  Non-tender.  Mobile.  AV.  Adnexae: No masses.  Non-tender to palpation.  Cul-de-sac: Negative for abnormality.   Multiple attempts to find the IUD and remove it using the IUD remover tool and an Allis clamp failed.  Patient tolerated this very well.          Assessment:    G1P0010 Patient Active Problem List   Diagnosis Date Noted   History of uterine fibroid 10/31/2022   Dermoid cyst of scalp 10/31/2022   GAD (generalized anxiety disorder) 01/14/2020   Panic attack 01/14/2020   Sensorineural hearing loss (SNHL) of both ears 10/22/2019   Submucous myoma of uterus 09/01/2019   Leiomyoma 07/16/2019   Hyperlipidemia 07/16/2019   Bilateral hearing loss 01/14/2019   Allergic rhinitis 01/14/2019     1. Fibroids   2. Abnormal uterine bleeding (AUB)   3. Pelvic pain   4. Malpositioned intrauterine device (IUD), subsequent encounter     Patient with a long history of uterine fibroids pelvic pain and heavy bleeding.  Recently IUD has become malpositioned and she is having pain from it.  She would like to have a hysterectomy for her pelvic pain and uterine fibroids.   Plan:            1.  We have discussed options for management.  These include removal of IUD and replacement presumably higher end in a more appropriate position.  This option is a possibility because this IUD was working for several years to control her bleeding and pain.  Another option would be to have uterine fibroid embolization.  Finally hysterectomy is certainly an option if the patient desires.  Because she has not had childbirth, size of the uterus may be an issue.  Possibility of abdominal hysterectomy discussed in detail.  Based on her pelvic examination today I think it is possible to do an LAVH but I would certainly consent her for abdominal as well should we find her uterus is  not amenable to removal vaginally.  She has decided that she does not want to go forth with UFE or removal and replacement of IUD.  She would like a hysterectomy.  She was scheduled a preop appointment. Orders No orders of the defined types were placed in this encounter.   No orders of the defined types were placed in this encounter.     F/U  Return in about 3 weeks (around 03/10/2024).  Alm DOROTHA Sar, M.D. 02/18/2024 2:33 PM

## 2024-02-18 NOTE — Progress Notes (Signed)
 Patient presents today to discuss a hysterectomy. She states daily pain and bleeding even while taking Aygestin . She feels like her IUD is stabbing her daily. Recent ultrasound preformed on 01/16/24.

## 2024-02-24 NOTE — Telephone Encounter (Signed)
 Waiting to hear back from [place]

## 2024-03-10 NOTE — Addendum Note (Signed)
 Addended by: WATT HILA B on: 03/10/2024 02:48 PM   Modules accepted: Orders

## 2024-03-11 ENCOUNTER — Other Ambulatory Visit: Payer: Self-pay | Admitting: Obstetrics and Gynecology

## 2024-03-11 MED ORDER — MICROGESTIN 24 FE 1-20 MG-MCG PO TABS
1.0000 | ORAL_TABLET | Freq: Every day | ORAL | 0 refills | Status: DC
Start: 2024-03-11 — End: 2024-05-05

## 2024-03-11 NOTE — Addendum Note (Signed)
 Addended by: WATT HILA B on: 03/11/2024 09:47 AM   Modules accepted: Orders

## 2024-03-11 NOTE — Progress Notes (Signed)
 Rx OCPs for AUB/menorrhagia with IUD and lieo, awaiting hyst

## 2024-03-11 NOTE — Telephone Encounter (Signed)
 Pls put pt on lab schedule for 9:00 appt tom. Pt aware, lab orders in.

## 2024-03-12 ENCOUNTER — Other Ambulatory Visit

## 2024-03-12 DIAGNOSIS — N939 Abnormal uterine and vaginal bleeding, unspecified: Secondary | ICD-10-CM

## 2024-03-13 LAB — CBC WITH DIFFERENTIAL/PLATELET
Basophils Absolute: 0 x10E3/uL (ref 0.0–0.2)
Basos: 1 %
EOS (ABSOLUTE): 0.2 x10E3/uL (ref 0.0–0.4)
Eos: 3 %
Hematocrit: 41.7 % (ref 34.0–46.6)
Hemoglobin: 13.7 g/dL (ref 11.1–15.9)
Immature Grans (Abs): 0 x10E3/uL (ref 0.0–0.1)
Immature Granulocytes: 0 %
Lymphocytes Absolute: 1.5 x10E3/uL (ref 0.7–3.1)
Lymphs: 27 %
MCH: 31.9 pg (ref 26.6–33.0)
MCHC: 32.9 g/dL (ref 31.5–35.7)
MCV: 97 fL (ref 79–97)
Monocytes Absolute: 0.5 x10E3/uL (ref 0.1–0.9)
Monocytes: 9 %
Neutrophils Absolute: 3.3 x10E3/uL (ref 1.4–7.0)
Neutrophils: 59 %
Platelets: 309 x10E3/uL (ref 150–450)
RBC: 4.29 x10E6/uL (ref 3.77–5.28)
RDW: 11.9 % (ref 11.7–15.4)
WBC: 5.5 x10E3/uL (ref 3.4–10.8)

## 2024-03-13 LAB — TSH: TSH: 1.27 u[IU]/mL (ref 0.450–4.500)

## 2024-03-15 ENCOUNTER — Ambulatory Visit: Payer: Self-pay | Admitting: Obstetrics and Gynecology

## 2024-03-25 ENCOUNTER — Encounter: Payer: Self-pay | Admitting: Obstetrics and Gynecology

## 2024-03-25 ENCOUNTER — Ambulatory Visit: Admitting: Obstetrics and Gynecology

## 2024-03-25 VITALS — BP 129/87 | HR 98 | Ht 64.0 in | Wt 213.0 lb

## 2024-03-25 DIAGNOSIS — T8332XD Displacement of intrauterine contraceptive device, subsequent encounter: Secondary | ICD-10-CM | POA: Diagnosis not present

## 2024-03-25 DIAGNOSIS — N939 Abnormal uterine and vaginal bleeding, unspecified: Secondary | ICD-10-CM

## 2024-03-25 DIAGNOSIS — D259 Leiomyoma of uterus, unspecified: Secondary | ICD-10-CM | POA: Diagnosis not present

## 2024-03-25 DIAGNOSIS — Z01818 Encounter for other preprocedural examination: Secondary | ICD-10-CM

## 2024-03-25 DIAGNOSIS — D219 Benign neoplasm of connective and other soft tissue, unspecified: Secondary | ICD-10-CM

## 2024-03-25 MED ORDER — METRONIDAZOLE 500 MG PO TABS: 500.0000 mg | ORAL_TABLET | Freq: Two times a day (BID) | ORAL | 0 refills | Status: DC

## 2024-03-25 MED ORDER — VALACYCLOVIR HCL 1 G PO TABS: 2000.0000 mg | ORAL_TABLET | Freq: Two times a day (BID) | ORAL | 0 refills | Status: AC

## 2024-03-25 NOTE — H&P (Signed)
 PRE-OPERATIVE HISTORY AND PHYSICAL EXAM  PCP:  Cyndi Shaver, PA-C Subjective:   HPI:  Nichole Cain is a 38 y.o. G1P0010.  No LMP recorded. (Menstrual status: IUD).  She presents today for a pre-op discussion and PE.  She has the following symptoms: Large uterine fibroids, menorrhagia, dysmenorrhea, malpositioned IUD.  Review of Systems:   Constitutional: Denied constitutional symptoms, night sweats, recent illness, fatigue, fever, insomnia and weight loss.  Eyes: Denied eye symptoms, eye pain, photophobia, vision change and visual disturbance.  Ears/Nose/Throat/Neck: Denied ear, nose, throat or neck symptoms, hearing loss, nasal discharge, sinus congestion and sore throat.  Cardiovascular: Denied cardiovascular symptoms, arrhythmia, chest pain/pressure, edema, exercise intolerance, orthopnea and palpitations.  Respiratory: Denied pulmonary symptoms, asthma, pleuritic pain, productive sputum, cough, dyspnea and wheezing.  Gastrointestinal: Denied, gastro-esophageal reflux, melena, nausea and vomiting.  Genitourinary: See above  Musculoskeletal: Denied musculoskeletal symptoms, stiffness, swelling, muscle weakness and myalgia.  Dermatologic: Denied dermatology symptoms, rash and scar.  Neurologic: Denied neurology symptoms, dizziness, headache, neck pain and syncope.  Psychiatric: Denied psychiatric symptoms, anxiety and depression.  Endocrine: Denied endocrine symptoms including hot flashes and night sweats.   OB History  Gravida Para Term Preterm AB Living  1    1   SAB IAB Ectopic Multiple Live Births   1       # Outcome Date GA Lbr Len/2nd Weight Sex Type Anes PTL Lv  1 IAB             Past Medical History:  Diagnosis Date   Allergy    Anxiety    Hearing loss    wears hearing aids b/l    Sinusitis    right maxillary    Past Surgical History:  Procedure Laterality Date   INNER EAR SURGERY     tubes x 9 and skin grafts in ear drums   MYOMECTOMY         SOCIAL HISTORY:  Social History   Tobacco Use  Smoking Status Former   Current packs/day: 0.50   Average packs/day: 0.5 packs/day for 17.0 years (8.5 ttl pk-yrs)   Types: Cigarettes  Smokeless Tobacco Never  Tobacco Comments   smoker since 20s to 2017 10+ years max 1 pk lasting 2 days to 1 ppd    Social History   Substance and Sexual Activity  Alcohol Use Yes   Comment: occasionally     Social History   Substance and Sexual Activity  Drug Use Not Currently    Family History  Problem Relation Age of Onset   Anxiety disorder Mother    Depression Mother    Heart disease Mother        MI   Hyperlipidemia Mother    Hypertension Mother    Heart disease Maternal Grandfather        CHF   Cancer Paternal Grandfather        testicular cancer    ALLERGIES:  Patient has no known allergies.  MEDS:   Current Outpatient Medications on File Prior to Visit  Medication Sig Dispense Refill   cetirizine (ZYRTEC) 10 MG tablet Take 10 mg by mouth daily as needed for allergies.     levonorgestrel  (MIRENA ) 20 MCG/24HR IUD 1 each by Intrauterine route once.     Norethindrone  Acetate-Ethinyl Estrad-FE (MICROGESTIN  24 FE) 1-20 MG-MCG(24) tablet Take 1 tablet by mouth daily. 84 tablet 0   fluticasone (FLONASE) 50 MCG/ACT nasal spray Place into both nostrils daily.     No  current facility-administered medications on file prior to visit.    No orders of the defined types were placed in this encounter.    Physical examination BP 129/87   Pulse 98   Ht 5' 4 (1.626 m)   Wt 213 lb (96.6 kg)   BMI 36.56 kg/m   General NAD, Conversant  HEENT Atraumatic; Op clear with mmm.  Normo-cephalic.  Anicteric sclerae  Thyroid /Neck Smooth without nodularity or enlargement. Normal ROM.  Neck Supple.  Skin No rashes, lesions or ulceration. Normal palpated skin turgor. No nodularity.  Breasts: No masses or discharge.  Symmetric.  No axillary adenopathy.  Lungs: Clear to auscultation.No rales  or wheezes. Normal Respiratory effort, no retractions.  Heart: NSR.  No murmurs or rubs appreciated. No peripheral edema  Abdomen: Soft.  Non-tender.  Large mass consistent with uterus 1 cm below umbilicus no HSM. No hernia  Extremities: Moves all appropriately.  Normal ROM for age. No lymphadenopathy.  Neuro: Oriented to PPT.  Normal mood. Normal affect.     Pelvic:   Vulva: Normal appearance.  No lesions.  Vagina: No lesions or abnormalities noted.  Support: Normal pelvic support.  Urethra No masses tenderness or scarring.  Meatus Normal size without lesions or prolapse.  Cervix: Normal ectropion.  No lesions.  Anus: Normal exam.  No lesions.  Perineum: Normal exam.  No lesions.        Bimanual   Uterus: 18 weeks size non-tender.  Mobile.  AV.  Adnexae: No masses.  Non-tender to palpation.  Cul-de-sac: Negative for abnormality.   Assessment:   G1P0010 Patient Active Problem List   Diagnosis Date Noted   History of uterine fibroid 10/31/2022   Dermoid cyst of scalp 10/31/2022   GAD (generalized anxiety disorder) 01/14/2020   Panic attack 01/14/2020   Sensorineural hearing loss (SNHL) of both ears 10/22/2019   Submucous myoma of uterus 09/01/2019   Leiomyoma 07/16/2019   Hyperlipidemia 07/16/2019   Bilateral hearing loss 01/14/2019   Allergic rhinitis 01/14/2019    1. Pre-op exam   2. Fibroids   3. Malpositioned intrauterine device (IUD), subsequent encounter   4. Abnormal uterine bleeding (AUB)      Because her of her very large uterus with fibroids and her nulliparous condition with a well supported cervix I do not think a laparoscopic hysterectomy will be possible.  I believe it is in her best interest to undergo TAH.  We have discussed the rationale for this decision and she is in agreement.  Plan:   Orders: No orders of the defined types were placed in this encounter.    1.  TAH, bilateral salpingectomy, removal of IUD.

## 2024-03-25 NOTE — Progress Notes (Signed)
 PRE-OPERATIVE HISTORY AND PHYSICAL EXAM  PCP:  Cyndi Shaver, PA-C Subjective:   HPI:  Nichole Cain is a 38 y.o. G1P0010.  No LMP recorded. (Menstrual status: IUD).  She presents today for a pre-op discussion and PE.  She has the following symptoms: Large uterine fibroids, menorrhagia, dysmenorrhea, malpositioned IUD.  Review of Systems:   Constitutional: Denied constitutional symptoms, night sweats, recent illness, fatigue, fever, insomnia and weight loss.  Eyes: Denied eye symptoms, eye pain, photophobia, vision change and visual disturbance.  Ears/Nose/Throat/Neck: Denied ear, nose, throat or neck symptoms, hearing loss, nasal discharge, sinus congestion and sore throat.  Cardiovascular: Denied cardiovascular symptoms, arrhythmia, chest pain/pressure, edema, exercise intolerance, orthopnea and palpitations.  Respiratory: Denied pulmonary symptoms, asthma, pleuritic pain, productive sputum, cough, dyspnea and wheezing.  Gastrointestinal: Denied, gastro-esophageal reflux, melena, nausea and vomiting.  Genitourinary: See above  Musculoskeletal: Denied musculoskeletal symptoms, stiffness, swelling, muscle weakness and myalgia.  Dermatologic: Denied dermatology symptoms, rash and scar.  Neurologic: Denied neurology symptoms, dizziness, headache, neck pain and syncope.  Psychiatric: Denied psychiatric symptoms, anxiety and depression.  Endocrine: Denied endocrine symptoms including hot flashes and night sweats.   OB History  Gravida Para Term Preterm AB Living  1    1   SAB IAB Ectopic Multiple Live Births   1       # Outcome Date GA Lbr Len/2nd Weight Sex Type Anes PTL Lv  1 IAB             Past Medical History:  Diagnosis Date   Allergy    Anxiety    Hearing loss    wears hearing aids b/l    Sinusitis    right maxillary    Past Surgical History:  Procedure Laterality Date   INNER EAR SURGERY     tubes x 9 and skin grafts in ear drums   MYOMECTOMY         SOCIAL HISTORY:  Social History   Tobacco Use  Smoking Status Former   Current packs/day: 0.50   Average packs/day: 0.5 packs/day for 17.0 years (8.5 ttl pk-yrs)   Types: Cigarettes  Smokeless Tobacco Never  Tobacco Comments   smoker since 20s to 2017 10+ years max 1 pk lasting 2 days to 1 ppd    Social History   Substance and Sexual Activity  Alcohol Use Yes   Comment: occasionally     Social History   Substance and Sexual Activity  Drug Use Not Currently    Family History  Problem Relation Age of Onset   Anxiety disorder Mother    Depression Mother    Heart disease Mother        MI   Hyperlipidemia Mother    Hypertension Mother    Heart disease Maternal Grandfather        CHF   Cancer Paternal Grandfather        testicular cancer    ALLERGIES:  Patient has no known allergies.  MEDS:   Current Outpatient Medications on File Prior to Visit  Medication Sig Dispense Refill   cetirizine (ZYRTEC) 10 MG tablet Take 10 mg by mouth daily as needed for allergies.     levonorgestrel  (MIRENA ) 20 MCG/24HR IUD 1 each by Intrauterine route once.     Norethindrone  Acetate-Ethinyl Estrad-FE (MICROGESTIN  24 FE) 1-20 MG-MCG(24) tablet Take 1 tablet by mouth daily. 84 tablet 0   fluticasone (FLONASE) 50 MCG/ACT nasal spray Place into both nostrils daily.     No  current facility-administered medications on file prior to visit.    No orders of the defined types were placed in this encounter.    Physical examination BP 129/87   Pulse 98   Ht 5' 4 (1.626 m)   Wt 213 lb (96.6 kg)   BMI 36.56 kg/m   General NAD, Conversant  HEENT Atraumatic; Op clear with mmm.  Normo-cephalic.  Anicteric sclerae  Thyroid /Neck Smooth without nodularity or enlargement. Normal ROM.  Neck Supple.  Skin No rashes, lesions or ulceration. Normal palpated skin turgor. No nodularity.  Breasts: No masses or discharge.  Symmetric.  No axillary adenopathy.  Lungs: Clear to auscultation.No rales  or wheezes. Normal Respiratory effort, no retractions.  Heart: NSR.  No murmurs or rubs appreciated. No peripheral edema  Abdomen: Soft.  Non-tender.  Large mass consistent with uterus 1 cm below umbilicus no HSM. No hernia  Extremities: Moves all appropriately.  Normal ROM for age. No lymphadenopathy.  Neuro: Oriented to PPT.  Normal mood. Normal affect.     Pelvic:   Vulva: Normal appearance.  No lesions.  Vagina: No lesions or abnormalities noted.  Support: Normal pelvic support.  Urethra No masses tenderness or scarring.  Meatus Normal size without lesions or prolapse.  Cervix: Normal ectropion.  No lesions.  Anus: Normal exam.  No lesions.  Perineum: Normal exam.  No lesions.        Bimanual   Uterus: 18 weeks size non-tender.  Mobile.  AV.  Adnexae: No masses.  Non-tender to palpation.  Cul-de-sac: Negative for abnormality.   Assessment:   G1P0010 Patient Active Problem List   Diagnosis Date Noted   History of uterine fibroid 10/31/2022   Dermoid cyst of scalp 10/31/2022   GAD (generalized anxiety disorder) 01/14/2020   Panic attack 01/14/2020   Sensorineural hearing loss (SNHL) of both ears 10/22/2019   Submucous myoma of uterus 09/01/2019   Leiomyoma 07/16/2019   Hyperlipidemia 07/16/2019   Bilateral hearing loss 01/14/2019   Allergic rhinitis 01/14/2019    1. Pre-op exam   2. Fibroids   3. Malpositioned intrauterine device (IUD), subsequent encounter   4. Abnormal uterine bleeding (AUB)      Because her of her very large uterus with fibroids and her nulliparous condition with a well supported cervix I do not think a laparoscopic hysterectomy will be possible.  I believe it is in her best interest to undergo TAH.  We have discussed the rationale for this decision and she is in agreement.  Plan:   Orders: No orders of the defined types were placed in this encounter.    1.  TAH, bilateral salpingectomy, removal of IUD.  Pre-op discussions regarding Risks and  Benefits of her scheduled surgery.  TAH The procedure of Total Abdominal Hysterectomy was described to the patient in detail.  We reviewed the rationale for Hysterectomy and the patient was again informed of other non-surgical management possibilities for her condition.  She has considered these other options, and desires a Hysterectomy.  We have reviewed the fact that Hysterectomy is permanent and that following the procedure she will not be able to become pregnant or bear children.  We have discussed the following risk factors specifically, and the patient has also been informed that additional complications not mentioned may develop;  damage to bowel, bladder, ureters, or to other internal organs, bleeding, infection and the risk from anesthesia.  We have discussed the procedure itself in detail and she has an informed understanding of this surgery.  We have also discussed the recovery period in which physical and sexual activity will be restricted for a varying degree of time, often 3 - 6 weeks.  As with all hysterectomies it has now become standard to perform bilateral salpingectomy.  This has been shown to decrease the future risk of ovarian cancer.  I have discussed this with her and she plans to have her fallopian tubes removed at the time of surgery.  I have answered all of her questions and I believe she has an informed understanding of Abdominal Hysterectomy. Rationale for abdominal hysterectomy versus laparoscopic hysterectomy discussed.  All questions answered.  Alm DOROTHA Sar, M.D. 03/25/2024 1:43 PM

## 2024-03-25 NOTE — Progress Notes (Signed)
Patient presents today for a pre-op exam prior to hysterectomy. She states no additional concerns today.

## 2024-04-02 DIAGNOSIS — Z0289 Encounter for other administrative examinations: Secondary | ICD-10-CM

## 2024-04-04 ENCOUNTER — Encounter: Payer: Self-pay | Admitting: Urgent Care

## 2024-04-09 ENCOUNTER — Encounter
Admission: RE | Admit: 2024-04-09 | Discharge: 2024-04-09 | Disposition: A | Discharge status: Home / Self Care | Source: Ambulatory Visit | Attending: Obstetrics and Gynecology | Admitting: Obstetrics and Gynecology

## 2024-04-09 ENCOUNTER — Other Ambulatory Visit: Payer: Self-pay

## 2024-04-09 VITALS — Ht 64.0 in | Wt 213.0 lb

## 2024-04-09 DIAGNOSIS — Z01818 Encounter for other preprocedural examination: Secondary | ICD-10-CM

## 2024-04-09 NOTE — Patient Instructions (Addendum)
 Your procedure is scheduled on: Monday 04/20/24 Report to the Registration Desk on the 1st floor of the [facility]. To find out your arrival time, please call [phone] between 1PM - 3PM on: Friday 04/17/24 If your arrival time is 6:00 am, do not arrive before that time as the [facility] entrance doors do not open until 6:00 am.  REMEMBER: Instructions that are not followed completely may result in serious medical risk, up to and including death; or upon the discretion of your surgeon and anesthesiologist your surgery may need to be rescheduled.  Do not eat food or drink any liquids after midnight the night before surgery.  No gum chewing or hard candies.  One week prior to surgery: Stop Anti-inflammatories (NSAIDS) such as Advil, Aleve, Ibuprofen, Motrin, Naproxen, Naprosyn and Aspirin based products such as Excedrin, Goody's Powder, BC Powder.  You may however, continue to take Tylenol  if needed for pain up until the day of surgery.  Stop ANY OVER THE COUNTER supplements and vitamins until after surgery.  Continue taking all of your other prescription medications up until the day of surgery.  ON THE DAY OF SURGERY ONLY TAKE THESE MEDICATIONS WITH SIPS OF WATER:  cetirizine (ZYRTEC) 10 MG tablet   No Alcohol for 24 hours before or after surgery.  No Smoking including e-cigarettes for 24 hours before surgery.  No chewable tobacco products for at least 6 hours before surgery.  No nicotine patches on the day of surgery.  Do not use any recreational drugs for at least a week (preferably 2 weeks) before your surgery.  Please be advised that the combination of cocaine and anesthesia may have negative outcomes, up to and including death. If you test positive for cocaine, your surgery will be cancelled.  On the morning of surgery brush your teeth with toothpaste and water, you may rinse your mouth with mouthwash if you wish. Do not swallow any toothpaste or mouthwash.  Use CHG  Soap or wipes as directed on instruction sheet.  Do not shave body hair from the neck down 48 hours before surgery.  Do not wear lotions, powders, or perfumes.   Wear comfortable clothing (specific to your surgery type) to the hospital.  Do not wear jewelry, make-up, hairpins, clips or nail polish.  For welded (permanent) jewelry: bracelets, anklets, waist bands, etc.  Please have this removed prior to surgery.  If it is not removed, there is a chance that hospital personnel will need to cut it off on the day of surgery.  Contact lenses, hearing aids and dentures may not be worn into surgery.  Do not bring valuables to the hospital. [facility] is not responsible for any missing/lost belongings or valuables.   Notify your doctor if there is any change in your medical condition (cold, fever, infection).  After surgery, you can help prevent lung complications by doing breathing exercises.  Take deep breaths and cough every 1-2 hours. Your doctor may order a device called an Incentive Spirometer to help you take deep breaths.  When coughing or sneezing, hold a pillow firmly against your incision with both hands. This is called "splinting." Doing this helps protect your incision. It also decreases belly discomfort.  If you are being admitted to the hospital overnight, leave your suitcase in the car. After surgery it may be brought to your room.  In case of increased patient census, it may be necessary for you, the patient, to continue your postoperative care in the Same Day Surgery  department.  Please call the Pre-admissions Testing Dept. at [phone] if you have any questions about these instructions.  Surgery Visitation Policy:  Patients having surgery or a procedure may have two visitors.  Children under the age of 64 must have an adult with them who is not the patient.  Inpatient Visitation:    Visiting hours are 7 a.m. to 8 p.m. Up to four visitors are allowed at one time  in a patient room. The visitors may rotate out with other people during the day.  One visitor age 53 or older may stay with the patient overnight and must be in the room by 8 p.m.   Merchandiser, retail to address health-related social needs:  [email or web address]                                                                                                             Preparing for Surgery with CHLORHEXIDINE GLUCONATE (CHG) Soap  Chlorhexidine Gluconate (CHG) Soap  o An antiseptic cleaner that kills germs and bonds with the skin to continue killing germs even after washing  o Used for showering the night before surgery and morning of surgery  Before surgery, you can play an important role by reducing the number of germs on your skin.  CHG (Chlorhexidine gluconate) soap is an antiseptic cleanser which kills germs and bonds with the skin to continue killing germs even after washing.  Please do not use if you have an allergy to CHG or antibacterial soaps. If your skin becomes reddened/irritated stop using the CHG.  1. Shower the NIGHT BEFORE SURGERY and the MORNING OF SURGERY with CHG soap.  2. If you choose to wash your hair, wash your hair first as usual with your normal shampoo.  3. After shampooing, rinse your hair and body thoroughly to remove the shampoo.  4. Use CHG as you would any other liquid soap. You can apply CHG directly to the skin and wash gently with a scrungie or a clean washcloth.  5. Apply the CHG soap to your body only from the neck down. Do not use on open wounds or open sores. Avoid contact with your eyes, ears, mouth, and genitals (private parts). Wash face and genitals (private parts) with your normal soap.  6. Wash thoroughly, paying special attention to the area where your surgery will be performed.  7. Thoroughly rinse your body with warm water.  8. Do not shower/wash with your normal soap after using and rinsing off the CHG  soap.  9. Pat yourself dry with a clean towel.  10. Wear clean pajamas to bed the night before surgery.  12. Place clean sheets on your bed the night of your first shower and do not sleep with pets.  13. Shower again with the CHG soap on the day of surgery prior to arriving at the hospital.  14. Do not apply any deodorants/lotions/powders.  15. Please wear clean clothes to the hospital.

## 2024-04-15 ENCOUNTER — Encounter: Admission: RE | Admit: 2024-04-15 | Source: Ambulatory Visit

## 2024-04-17 ENCOUNTER — Telehealth: Payer: Self-pay | Admitting: Obstetrics & Gynecology

## 2024-04-17 NOTE — Telephone Encounter (Signed)
 Patient aware of reschedule, Patient adamant about keeping her same date of 9/8 as she has planned arrangements with work to be off, arrangements with friends & family to care for her and her kids. Patient was also concerned as she was told this could become "cancerous".    ADDED INFORMATION: Patient called on 04/14/2024 asking if her surgery has been assigned with another MD yet, informed that practice admin is working on this and I would update her later today. Patient states that it would be crazy to not have another surgeon under the [facility] umbrella to perform the surgery on the date scheduled. Patient also stated that she received a disclosure message stating that if the surgery would need to be rescheduled at a practice not covered by her insurance that it would be covered at the in-network level and wants to know if this was the case could she still have it on 9/8. I informed the patient that I would be unable to answer that question as I do not have a way of knowing what the outside practice availability is for their OR's and that I wanted to be very transparent and not tell her things that I cannot deliver on. Patient states she is looking forward to feedback.

## 2024-04-20 ENCOUNTER — Encounter: Admission: RE | Payer: Self-pay | Source: Home / Self Care

## 2024-04-20 ENCOUNTER — Inpatient Hospital Stay: Admission: RE | Admit: 2024-04-20 | Source: Home / Self Care | Admitting: Obstetrics and Gynecology

## 2024-04-20 SURGERY — HYSTERECTOMY, TOTAL, ABDOMINAL, WITH SALPINGECTOMY
Anesthesia: Choice

## 2024-04-21 ENCOUNTER — Encounter: Payer: Self-pay | Admitting: Obstetrics & Gynecology

## 2024-04-21 ENCOUNTER — Ambulatory Visit: Admitting: Obstetrics & Gynecology

## 2024-04-21 VITALS — BP 133/83 | HR 99 | Ht 64.0 in | Wt 214.2 lb

## 2024-04-21 DIAGNOSIS — Z01818 Encounter for other preprocedural examination: Secondary | ICD-10-CM

## 2024-04-21 DIAGNOSIS — D259 Leiomyoma of uterus, unspecified: Secondary | ICD-10-CM

## 2024-04-21 DIAGNOSIS — N939 Abnormal uterine and vaginal bleeding, unspecified: Secondary | ICD-10-CM | POA: Diagnosis not present

## 2024-04-21 DIAGNOSIS — D219 Benign neoplasm of connective and other soft tissue, unspecified: Secondary | ICD-10-CM

## 2024-04-21 DIAGNOSIS — T8332XD Displacement of intrauterine contraceptive device, subsequent encounter: Secondary | ICD-10-CM

## 2024-04-21 NOTE — Progress Notes (Signed)
 GYN VISIT Patient name: Nichole Cain MRN [id number]  Date of birth: 1985-09-19 Chief Complaint:   Pre-op Exam  History of Present Illness:   Nichole Cain is a 39 y.o. [id number] female being seen today for preop appointment.  Patient was initially scheduled with Dr. Janit; however, Nichole Cain to unforeseen circumstances the surgery was canceled as Dr. Janit is no longer with [facility].  He presents today to reschedule for hysterectomy.  In review, she notes long standing h/o HMB and AUB.  In the past she has tried to manage her symptoms medically and has been on both hormonal pills as well as an IUD in the past. Mirena  was working for quite a while until a few mos ago then started having sharp pains and bleeding- initially bleeding then it never stopped.  Notes that strings were not visible-[place] had tried tried hook and brush in office and unable to remove IUD-attempts made by 2 different physicians  Currently on COCs which has helped improve her bleeding- some spotting, but overall very tolerable.    Patient was initially scheduled for total abdominal hysterectomy and bilateral salpingectomy earlier this month.  US  01/2024: 11.3 x 8.4 x 8.6 cm uterus.  Volume calculates: 427cc Multiple fibroids largest fibroid measuring 6.9 x 7 x 6.7 cm.  Normal ovaries bilaterally  Patient presents today to discuss rescheduling surgery.  No LMP recorded (lmp unknown). (Menstrual status: IUD).    Review of Systems:   Pertinent items are noted in HPI Denies fever/chills, dizziness, headaches, visual disturbances, fatigue, shortness of breath, chest pain, abdominal pain, vomiting Pertinent History Reviewed:   Past Surgical History:  Procedure Laterality Date   INNER EAR SURGERY     tubes x 9 and skin grafts in ear drums   MYOMECTOMY      Past Medical History:  Diagnosis Date   Allergy    Anxiety    Hearing loss    wears hearing aids b/l    Sinusitis    right maxillary    Reviewed problem list, medications and allergies. Physical Assessment:   Vitals:   04/21/24 1501  BP: 133/83  Pulse: 99  Weight: 214 lb 3.2 oz (97.2 kg)  Height: 5' 4 (1.626 m)  Body mass index is 36.77 kg/m.       Physical Examination:   General appearance: alert, well appearing, and in no distress  Psych: mood appropriate, normal affect  Skin: warm & dry   Cardiovascular: normal heart rate noted  Respiratory: normal respiratory effort, no distress  Abdomen: soft, non-tender, uterus below umbilicus, no rebound, no guarding  Pelvic: VULVA: normal appearing vulva with no masses, tenderness or lesions, VAGINA: normal appearing vagina with normal color and discharge, no lesions, CERVIX: normal appearing cervix without discharge or lesions, no strings visualized-, UTERUS: Enlarged and mobile, nontender.  No adnexal masses appreciated on exam extremities: no edema   Chaperone: Nichole Cain    Assessment & Plan:  1) AUB, pelvic pain,Preop visit -Discussed plan for robotic- assisted laparoscopic hysterectomy and bilateral salpingectomy-discussed that while her uterus is enlarged suspect that surgery can be completed laparoscopically.  Discussed potential for open conversion  -Explained that this surgery is performed to remove the uterus through several small incisions in the abdomen- pending anatomy 3-5 ports. I discussed the risks and benefits of the surgery, including, but not limited to risk of bleeding, including the need for blood transfusion, infection, damage to surrounding organs and tissues such as damage to bladder, ureter or bowel that  would requiring additional procedures.  Reviewed long term complications such as fistula or dehiscence requiring further surgical intervention.  Discussed potential for other complications that cannot be predicted or prevented including DVT, PE or death. -reviewed same day procedure, typical recovery 8-12 wks with several weeks off work and  8wks of pelvic rest -Also reviewed alternative options with patient including continuing on current COC, replacement of Mirena  or other conservative options. -questions/concerns were addressed and pt desires to proceed with permanent surgical intervention  -plan to schedule for Sept 23 -preop labs ordered  No orders of the defined types were placed in this encounter.   Return in about 1 week (around 04/28/2024) for postop from 9/23 (1week).   Nichole Counihan, DO Attending Obstetrician & Gynecologist, [facility] for [employer], [facility]

## 2024-04-28 ENCOUNTER — Encounter: Admitting: Obstetrics and Gynecology

## 2024-04-28 NOTE — Patient Instructions (Signed)
 Nichole Cain  04/28/2024     @[email or web address] @   Your procedure is scheduled on 05/05/2024.   Report to [facility] at 6:00 A.M.   Call this number if you have problems the morning of surgery:   [phone]  If you experience any cold or flu symptoms such as cough, fever, chills, shortness of breath, etc. between now and your scheduled surgery, please notify us  at the above number.   Remember:   Do not eat or drink after midnight.     Take these medicines the morning of surgery with A SIP OF WATER : Zyrtec    Do not wear jewelry, make-up or nail polish, including gel polish,  artificial nails, or any other type of covering on natural nails (fingers and  toes).  Do not wear lotions, powders, or perfumes, or deodorant.  Do not shave 48 hours prior to surgery.  Men may shave face and neck.  Do not bring valuables to the hospital.  [facility] is not responsible for any belongings or valuables.  Contacts, dentures or bridgework may not be worn into surgery.  Leave your suitcase in the car.  After surgery it may be brought to your room.  For patients admitted to the hospital, discharge time will be determined by your treatment team.  Patients discharged the day of surgery will not be allowed to drive home.   Name and phone number of your driver:   family  Special instructions:  n/A  Please read over the following fact sheets that you were given.  Care and Recovery After Surgery   Laparoscopically Assisted Vaginal Hysterectomy  A laparoscopic assisted vaginal hysterectomy (LAVH) is a surgery to remove the uterus. The surgery is minimally invasive. This means that very small incisions are made, instead of one large incision. This surgery can be done with or without a robot. During this surgery, your fallopian tubes and ovaries may also be taken out. This surgery may be done if you have: Growths in the uterus, such as fibroids. This is the most common  reason. Endometriosis. This is when the lining of the uterus is growing outside of the uterus. Pelvic organ prolapse. This is when the uterus has moved down and bulges into the vagina. Cancer of the uterus or cervix. Abnormal or heavy bleeding during your period. Long-term (chronic) pain in the pelvis. After this surgery, you'll no longer be able to have a baby, and you'll no longer have a period. Tell a health care provider about: Any allergies you have. All medicines you're taking. These include vitamins, herbs, eye drops, creams, and over-the-counter medicines. Any problems you or family members have had with anesthesia. Any bleeding problems you have. Any surgeries you've had. Any medical conditions you have. Whether you're pregnant or may be pregnant. What are the risks? Your health care provider will talk with you about risks. These may include: Bleeding or blood clots in the legs or lungs. Infection. Damage to nearby structures or organs, including nerve, bladder, or bowel injury. Reaction to the medicines used. Early symptoms of menopause. You may have hot flashes, mood swings, dryness of the vagina, and low estrogen if both ovaries are removed. What happens before the surgery? When to stop eating and drinking Follow instructions from your provider about what you may eat and drink. These may include: 8 hours before your surgery Stop eating most foods. Do not eat meat, fried foods, or fatty foods. Eat only light foods, such as toast or crackers.  All liquids are okay except energy drinks and alcohol. 6 hours before your surgery Stop eating. Drink only clear liquids, such as water, clear fruit juice, black coffee, plain tea, and sports drinks. Do not drink energy drinks or alcohol. 2 hours before your surgery Stop drinking all liquids. You may be allowed to take medicines with small sips of water. If you don't follow your provider's instructions, your surgery may be delayed  or canceled. Medicines Ask your provider about: Changing or stopping your regular medicines. These include any diabetes medicines or blood thinners you take. Taking medicines, such as aspirin and ibuprofen. These medicines can thin your blood. Do not take them unless you are told to. Vitamins, herbs, and supplements. You may be asked to take a medicine to empty your colon (bowel prep). Surgery safety For your safety, your provider may: Remove hair at the surgery site. Ask you to wash with a soap that kills germs. Give you antibiotics. General instructions If you were asked to do a bowel prep before the surgery, do it as told by your provider. Do not use any products that contain nicotine or tobacco for at least 4 weeks before the surgery. These products include cigarettes, chewing tobacco, and vaping devices, such as e-cigarettes. If you need help quitting, ask your provider. What happens during the surgery? You will be given: A sedative. This helps you relax. Anesthesia. This keeps you from feeling pain. It will make you fall asleep. Your surgeon will make several small cuts, or incisions, in your belly. Tools for surgery will be inserted into the small incisions. The surgeon will take out your uterus through the vagina. Your small incisions will be closed with stitches, skin glue, or tape strips. This surgery may vary among providers and hospitals. What happens after the surgery? Your blood pressure, heart rate, breathing rate, and blood oxygen level will be monitored until you leave the hospital. You will be given pain medicine as needed. You will have a soft tube, or catheter, in place for a few hours. It will likely be removed the day after surgery. You will wear a pad because you will have bleeding and discharge from the vagina. Where to find more information [employer]: acog.org This information is not intended to replace advice given to  you by your health care provider. Make sure you discuss any questions you have with your health care provider. Document Revised: 11/09/2022 Document Reviewed: 11/09/2022 Elsevier Patient Education  2024 [employer] Inc.  General Anesthesia, Adult General anesthesia is the use of medicine to make you fall asleep (unconscious) for a medical procedure. General anesthesia must be used for certain procedures. It is often recommended for surgery or procedures that: Last a long time. Require you to be still or in an unusual position. Are major and can cause blood loss. Affect your breathing. The medicines used for general anesthesia are called general anesthetics. During general anesthesia, these medicines are given along with medicines that: Prevent pain. Control your blood pressure. Relax your muscles. Prevent nausea and vomiting after the procedure. Tell a health care provider about: Any allergies you have. All medicines you are taking, including vitamins, herbs, eye drops, creams, and over-the-counter medicines. Your history of any: Medical conditions you have, including: High blood pressure. Bleeding problems. Diabetes. Heart or lung conditions, such as: Heart failure. Sleep apnea. Asthma. Chronic obstructive pulmonary disease (COPD). Current or recent illnesses, such as: Upper respiratory, chest, or ear infections. Cough or fever. Tobacco or  drug use, including marijuana or alcohol use. Depression or anxiety. Surgeries and types of anesthetics you have had. Problems you or family members have had with anesthetic medicines. Whether you are pregnant or may be pregnant. Whether you have any chipped or loose teeth, dentures, caps, bridgework, or issues with your mouth, swallowing, or choking. What are the risks? Your health care provider will talk with you about risks. These may include: Allergic reaction to the medicines. Lung and heart problems. Inhaling food or liquid from the  stomach into the lungs (aspiration). Nerve injury. Injury to the lips, mouth, teeth, or gums. Stroke. Waking up during your procedure and being unable to move. This is rare. These problems are more likely to develop if you are having a major surgery or if you have an advanced or serious medical condition. You can prevent some of these complications by answering all of your health care provider's questions thoroughly and by following all instructions before your procedure. General anesthesia can cause side effects, including: Nausea or vomiting. A sore throat or hoarseness from the breathing tube. Wheezing or coughing. Shaking chills or feeling cold. Body aches. Sleepiness. Confusion, agitation (delirium), or anxiety. What happens before the procedure? When to stop eating and drinking Follow instructions from your health care provider about what you may eat and drink before your procedure. If you do not follow your health care provider's instructions, your procedure may be delayed or canceled. Medicines Ask your health care provider about: Changing or stopping your regular medicines. These include any diabetes medicines or blood thinners you take. Taking medicines such as aspirin and ibuprofen. These medicines can thin your blood. Do not take them unless your health care provider tells you to. Taking over-the-counter medicines, vitamins, herbs, and supplements. General instructions Do not use any products that contain nicotine or tobacco for at least 4 weeks before the procedure. These products include cigarettes, chewing tobacco, and vaping devices, such as e-cigarettes. If you need help quitting, ask your health care provider. If you brush your teeth on the morning of the procedure, make sure to spit out all of the water and toothpaste. If told by your health care provider, bring your sleep apnea device with you to surgery (if applicable). If you will be going home right after the  procedure, plan to have a responsible adult: Take you home from the hospital or clinic. You will not be allowed to drive. Care for you for the time you are told. What happens during the procedure?  An IV will be inserted into one of your veins. You will be given one or more of the following through a face mask or IV: A sedative. This helps you relax. Anesthesia. This will: Numb certain areas of your body. Make you fall asleep for surgery. After you are unconscious, a breathing tube may be inserted down your throat to help you breathe. This will be removed before you wake up. An anesthesia provider, such as an anesthesiologist, will stay with you throughout your procedure. The anesthesia provider will: Keep you comfortable and safe by continuing to give you medicines and adjusting the amount of medicine that you get. Monitor your blood pressure, heart rate, and oxygen levels to make sure that the anesthetics do not cause any problems. The procedure may vary among health care providers and hospitals. What happens after the procedure? Your blood pressure, temperature, heart rate, breathing rate, and blood oxygen level will be monitored until you leave the hospital or clinic. You will wake  up in a recovery area. You may wake up slowly. You may be given medicine to help you with pain, nausea, or any other side effects from the anesthesia. Summary General anesthesia is the use of medicine to make you fall asleep (unconscious) for a medical procedure. Follow your health care provider's instructions about when to stop eating, drinking, or taking certain medicines before your procedure. Plan to have a responsible adult take you home from the hospital or clinic. This information is not intended to replace advice given to you by your health care provider. Make sure you discuss any questions you have with your health care provider. Document Revised: 10/26/2021 Document Reviewed: 10/26/2021 Elsevier  Patient Education  2024 [employer].  How to Use an Incentive Spirometer An incentive spirometer is a tool that measures how well you are filling your lungs with each breath. Learning to take long, deep breaths using this tool can help you keep your lungs clear and active. This may help to reverse or lessen your chance of developing breathing (pulmonary) problems, especially infection. You may be asked to use a spirometer: After a surgery. If you have a lung problem or a history of smoking. After a long period of time when you have been unable to move or be active. If the spirometer includes an indicator to show the highest number that you have reached, your health care provider or respiratory therapist will help you set a goal. Keep a log of your progress as told by your health care provider. What are the risks? Breathing too quickly may cause dizziness or cause you to pass out. Take your time so you do not get dizzy or light-headed. If you are in pain, you may need to take pain medicine before doing incentive spirometry. It is harder to take a deep breath if you are having pain. How to use your incentive spirometer  Sit up on the edge of your bed or on a chair. Hold the incentive spirometer so that it is in an upright position. Before you use the spirometer, breathe out normally. Place the mouthpiece in your mouth. Make sure your lips are closed tightly around it. Breathe in slowly and as deeply as you can through your mouth, causing the piston or the ball to rise toward the top of the chamber. Hold your breath for 3-5 seconds, or for as long as possible. If the spirometer includes a coach indicator, use this to guide you in breathing. Slow down your breathing if the indicator goes above the marked areas. Remove the mouthpiece from your mouth and breathe out normally. The piston or ball will return to the bottom of the chamber. Rest for a few seconds, then repeat the steps 10 or more  times. Take your time and take a few normal breaths between deep breaths so that you do not get dizzy or light-headed. Do this every 1-2 hours when you are awake. If the spirometer includes a goal marker to show the highest number you have reached (best effort), use this as a goal to work toward during each repetition. After each set of 10 deep breaths, cough a few times. This will help to make sure that your lungs are clear. If you have an incision on your chest or abdomen from surgery, place a pillow or a rolled-up towel firmly against the incision when you cough. This can help to reduce pain while taking deep breaths and coughing. General tips When you are able to get out of bed:  Walk around often. Continue to take deep breaths and cough in order to clear your lungs. Keep using the incentive spirometer until your health care provider says it is okay to stop using it. If you have been in the hospital, you may be told to keep using the spirometer at home. Contact a health care provider if: You are having difficulty using the spirometer. You have trouble using the spirometer as often as instructed. Your pain medicine is not giving enough relief for you to use the spirometer as told. You have a fever. Get help right away if: You develop shortness of breath. You develop a cough with bloody mucus from the lungs. You have fluid or blood coming from an incision site after you cough. Summary An incentive spirometer is a tool that can help you learn to take long, deep breaths to keep your lungs clear and active. You may be asked to use a spirometer after a surgery, if you have a lung problem or a history of smoking, or if you have been inactive for a long period of time. Use your incentive spirometer as instructed every 1-2 hours while you are awake. If you have an incision on your chest or abdomen, place a pillow or a rolled-up towel firmly against your incision when you cough. This will help to  reduce pain. Get help right away if you have shortness of breath, you cough up bloody mucus, or blood comes from your incision when you cough. This information is not intended to replace advice given to you by your health care provider. Make sure you discuss any questions you have with your health care provider. Document Revised: 06/07/2023 Document Reviewed: 06/07/2023 Elsevier Patient Education  2024 Else[employer] Inc.  How to Use Chlorhexidine at Home in the Shower Chlorhexidine gluconate (CHG) is a germ-killing (antiseptic) wash that's used to clean the skin. It can get rid of the germs that normally live on the skin and can keep them away for about 24 hours. If you're having surgery, you may be told to shower with CHG at home the night before surgery. This can help lower your risk for infection. To use CHG wash in the shower, follow the steps below. Supplies needed: CHG body wash. Clean washcloth. Clean towel. How to use CHG in the shower Follow these steps unless you're told to use CHG in a different way: Start the shower. Use your normal soap and shampoo to wash your face and hair. Turn off the shower or move out of the shower stream. Pour CHG onto a clean washcloth. Do not use any type of brush or rough sponge. Start at your neck, washing your body down to your toes. Make sure you: Wash the part of your body where the surgery will be done for at least 1 minute. Do not scrub. Do not use CHG on your head or face unless your health care provider tells you to. If it gets into your ears or eyes, rinse them well with water. Do not wash your genitals with CHG. Wash your back and under your arms. Make sure to wash skin folds. Let the CHG sit on your skin for 1-2 minutes or as long as told. Rinse your entire body in the shower, including all body creases and folds. Turn off the shower. Dry off with a clean towel. Do not put anything on your skin afterward, such as powder, lotion, or  perfume. Put on clean clothes or pajamas. If it's the night before surgery, sleep  in clean sheets. General tips Use CHG only as told, and follow the instructions on the label. Use the full amount of CHG as told. This is often one bottle. Do not smoke and stay away from flames after using CHG. Your skin may feel sticky after using CHG. This is normal. The sticky feeling will go away as the CHG dries. Do not use CHG: If you have a chlorhexidine allergy or have reacted to chlorhexidine in the past. On open wounds or areas of skin that have broken skin, cuts, or scrapes. On babies younger than 72 months of age. Contact a health care provider if: You have questions about using CHG. Your skin gets irritated or itchy. You have a rash after using CHG. You swallow any CHG. Call your local poison control center [phone] in the [place] Your eyes itch badly, or they become very red or swollen. Your hearing changes. You have trouble seeing. If you can't reach your provider, go to an urgent care or emergency room. Do not drive yourself. Get help right away if: You have swelling or tingling in your mouth or throat. You make high-pitched whistling sounds when you breathe, most often when you breathe out (wheeze). You have trouble breathing. These symptoms may be an emergency. Call 911 right away. Do not wait to see if the symptoms will go away. Do not drive yourself to the hospital. This information is not intended to replace advice given to you by your health care provider. Make sure you discuss any questions you have with your health care provider. Document Revised: 02/12/2023 Document Reviewed: 02/08/2022 Elsevier Patient Education  2024 [employer].

## 2024-04-30 ENCOUNTER — Encounter (HOSPITAL_COMMUNITY): Payer: Self-pay

## 2024-04-30 ENCOUNTER — Encounter (HOSPITAL_COMMUNITY)
Admission: RE | Admit: 2024-04-30 | Discharge: 2024-04-30 | Disposition: A | Discharge status: Home / Self Care | Source: Ambulatory Visit | Attending: Obstetrics & Gynecology | Admitting: Obstetrics & Gynecology

## 2024-04-30 ENCOUNTER — Other Ambulatory Visit: Payer: Self-pay

## 2024-04-30 DIAGNOSIS — D219 Benign neoplasm of connective and other soft tissue, unspecified: Secondary | ICD-10-CM | POA: Diagnosis not present

## 2024-04-30 DIAGNOSIS — N920 Excessive and frequent menstruation with regular cycle: Secondary | ICD-10-CM | POA: Insufficient documentation

## 2024-04-30 DIAGNOSIS — Z01812 Encounter for preprocedural laboratory examination: Secondary | ICD-10-CM | POA: Insufficient documentation

## 2024-04-30 LAB — CBC
HCT: 38.4 % (ref 36.0–46.0)
Hemoglobin: 13.1 g/dL (ref 12.0–15.0)
MCH: 32.5 pg (ref 26.0–34.0)
MCHC: 34.1 g/dL (ref 30.0–36.0)
MCV: 95.3 fL (ref 80.0–100.0)
Platelets: 285 K/uL (ref 150–400)
RBC: 4.03 MIL/uL (ref 3.87–5.11)
RDW: 11.8 % (ref 11.5–15.5)
WBC: 6 K/uL (ref 4.0–10.5)
nRBC: 0 % (ref 0.0–0.2)

## 2024-04-30 LAB — BASIC METABOLIC PANEL WITH GFR
Anion gap: 12 (ref 5–15)
BUN: 11 mg/dL (ref 6–20)
CO2: 22 mmol/L (ref 22–32)
Calcium: 9.5 mg/dL (ref 8.9–10.3)
Chloride: 102 mmol/L (ref 98–111)
Creatinine, Ser: 0.69 mg/dL (ref 0.44–1.00)
GFR, Estimated: >60 mL/min (ref >60)
Glucose, Bld: 99 mg/dL (ref 70–99)
Potassium: 3.8 mmol/L (ref 3.5–5.1)
Sodium: 136 mmol/L (ref 135–145)

## 2024-04-30 LAB — TYPE AND SCREEN
ABO/RH(D): AB POS
Antibody Screen: NEGATIVE

## 2024-04-30 LAB — PREGNANCY, URINE: Preg Test, Ur: NEGATIVE

## 2024-05-04 MED ORDER — LACTATED RINGERS IV SOLN
INTRAVENOUS | Status: DC
Start: 2024-05-04 — End: 2024-05-04

## 2024-05-04 MED ORDER — CEFAZOLIN SODIUM-DEXTROSE 2-4 GM/100ML-% IV SOLN: 2.0000 g | INTRAVENOUS | Status: DC

## 2024-05-04 MED ORDER — ACETAMINOPHEN 500 MG PO TABS: 1000.0000 mg | ORAL_TABLET | ORAL | Status: DC

## 2024-05-04 MED ORDER — CELECOXIB 400 MG PO CAPS: 400.0000 mg | ORAL_CAPSULE | ORAL | Status: DC

## 2024-05-04 MED ORDER — POVIDONE-IODINE 10 % EX SWAB
2.0000 | Freq: Once | CUTANEOUS | Status: DC
Start: 2024-05-04 — End: 2024-05-04

## 2024-05-04 MED ORDER — GABAPENTIN 300 MG PO CAPS: 300.0000 mg | ORAL_CAPSULE | ORAL | Status: DC

## 2024-05-04 NOTE — H&P (Signed)
 [facility] Attending History and Physical  Nichole Cain is a 38 y.o. G1P0010 at Unknown who presents for scheduled Robotic assisted laparoscopic hysterectomy, bilateral salpingectomy, possible cystoscopy  In review, pt previously seen by Dr. Janit at [facility].   She notes long standing h/o HMB and AUB.  In the past she has tried to manage her symptoms medically and has been on both hormonal pills as well as an IUD in the past. Mirena  was working for quite a while until a few mos ago then started having sharp pains and bleeding- initially bleeding then it never stopped.  IUD removal unsuccessful on several attempts and now desires permanent surgical intervention.  She does not desire a pregnancy.  Currenly on OCP which is working well to improve her bleeding. No acute concerns or complaints since her prior visit.  Denies any abnormal vaginal discharge, fevers, chills, sweats, dysuria, nausea, vomiting, other GI or GU symptoms or other general symptoms.  Past Medical History:  Diagnosis Date   Allergy    Anxiety    Hearing loss    wears hearing aids b/l    Sinusitis    right maxillary   Past Surgical History:  Procedure Laterality Date   INNER EAR SURGERY     tubes x 9 and skin grafts in ear drums   MYOMECTOMY     OB History  Gravida Para Term Preterm AB Living  1    1   SAB IAB Ectopic Multiple Live Births   1       # Outcome Date GA Lbr Len/2nd Weight Sex Type Anes PTL Lv  1 IAB           Patient denies any other pertinent gynecologic issues.  No current facility-administered medications on file prior to encounter.   Current Outpatient Medications on File Prior to Encounter  Medication Sig Dispense Refill   cetirizine (ZYRTEC) 10 MG tablet Take 10 mg by mouth daily.     levonorgestrel  (MIRENA ) 20 MCG/24HR IUD 1 each by Intrauterine route once.     metroNIDAZOLE  (FLAGYL ) 500 MG tablet Take 1 tablet (500 mg total) by mouth 2 (two) times daily.  Begin 5 days prior to scheduled surgery as directed. (Patient not taking: Reported on 04/21/2024) 10 tablet 0   Norethindrone  Acetate-Ethinyl Estrad-FE (MICROGESTIN  24 FE) 1-20 MG-MCG(24) tablet Take 1 tablet by mouth daily. 84 tablet 0   No Known Allergies  Social History:   reports that she has quit smoking. Her smoking use included cigarettes. She has a 8.5 pack-year smoking history. She has never used smokeless tobacco. She reports current alcohol use. She reports that she does not currently use drugs. Family History  Problem Relation Age of Onset   Anxiety disorder Mother    Depression Mother    Heart disease Mother        MI   Hyperlipidemia Mother    Hypertension Mother    Heart disease Maternal Grandfather        CHF   Cancer Paternal Grandfather        testicular cancer    Review of Systems: Pertinent items noted in HPI and remainder of comprehensive ROS otherwise negative.  PHYSICAL EXAM: Blood pressure 117/77, pulse 81, temperature 98.7 F (37.1 C), temperature source Oral, resp. rate 16, height 5' 4 (1.626 m), weight 97.1 kg, last menstrual period 02/19/2024, SpO2 100%.   CONSTITUTIONAL: Well-developed, well-nourished female in no acute distress.  SKIN: Skin is warm and dry. No rash  noted. Not diaphoretic. No erythema. No pallor. NEUROLOGIC: Alert and oriented to person, place, and time. Normal reflexes, muscle tone coordination. No cranial nerve deficit noted. PSYCHIATRIC: Normal mood and affect. Normal behavior. Normal judgment and thought content. CARDIOVASCULAR: Normal heart rate noted, regular rhythm RESPIRATORY: Effort and breath sounds normal, no problems with respiration noted ABDOMEN: Soft, nontender, nondistended. PELVIC: deferred MUSCULOSKELETAL: no calf tenderness bilaterally EXT: no edema bilaterally, normal pulses  Labs: Results for orders placed or performed during the hospital encounter of 04/30/24 (from the past 2 weeks)  CBC   Collection Time:  04/30/24  3:24 PM  Result Value Ref Range   WBC 6.0 4.0 - 10.5 K/uL   RBC 4.03 3.87 - 5.11 MIL/uL   Hemoglobin 13.1 12.0 - 15.0 g/dL   HCT 61.5 63.9 - 53.9 %   MCV 95.3 80.0 - 100.0 fL   MCH 32.5 26.0 - 34.0 pg   MCHC 34.1 30.0 - 36.0 g/dL   RDW 88.1 88.4 - 84.4 %   Platelets 285 150 - 400 K/uL   nRBC 0.0 0.0 - 0.2 %  Basic metabolic panel   Collection Time: 04/30/24  3:24 PM  Result Value Ref Range   Sodium 136 135 - 145 mmol/L   Potassium 3.8 3.5 - 5.1 mmol/L   Chloride 102 98 - 111 mmol/L   CO2 22 22 - 32 mmol/L   Glucose, Bld 99 70 - 99 mg/dL   BUN 11 6 - 20 mg/dL   Creatinine, Ser 9.30 0.44 - 1.00 mg/dL   Calcium 9.5 8.9 - 89.6 mg/dL   GFR, Estimated >39 >39 mL/min   Anion gap 12 5 - 15  Pregnancy, urine   Collection Time: 04/30/24  3:24 PM  Result Value Ref Range   Preg Test, Ur NEGATIVE NEGATIVE  Type and screen   Collection Time: 04/30/24  3:24 PM  Result Value Ref Range   ABO/RH(D) AB POS    Antibody Screen NEG    Sample Expiration      05/14/2024,2359 Performed at [facility], [address], [place], [place] [address]     Imaging Studies: US  01/2024: 11.3 x 8.4 x 8.6 cm uterus.  Volume calculates: 427cc Multiple fibroids largest fibroid measuring 6.9 x 7 x 6.7 cm.  Normal ovaries bilaterally  Assessment: Abnormal uterine bleeding Pelvic pain   Plan: Robotic assisted laparoscopic hysterectomy, bilateral salpingectomy, possible cystoscopy -Ancef  2g IV -Toradol  preop -NPO -LR @ 125cc/hr -SCDs to OR -Risk/benefits and alternatives reviewed with the patient including but not limited to risk of bleeding, infection and injury to surrounding organs requiring further surgical intervention.  Detailed inform consent obtained in office and reviewed today.  Questions and concerns were addressed and pt desires to proceed  Mervyn Pflaum, DO Attending Obstetrician & Gynecologist, [facility] for [facility], [facility]

## 2024-05-05 ENCOUNTER — Encounter (HOSPITAL_COMMUNITY)
Admission: RE | Disposition: A | Discharge status: Home / Self Care | Payer: Self-pay | Source: Home / Self Care | Attending: Obstetrics & Gynecology

## 2024-05-05 ENCOUNTER — Ambulatory Visit (HOSPITAL_COMMUNITY)
Admission: RE | Admit: 2024-05-05 | Discharge: 2024-05-05 | Disposition: A | Discharge status: Home / Self Care | Attending: Obstetrics & Gynecology | Admitting: Obstetrics & Gynecology

## 2024-05-05 ENCOUNTER — Ambulatory Visit (HOSPITAL_COMMUNITY): Admitting: Certified Registered"

## 2024-05-05 ENCOUNTER — Encounter (HOSPITAL_COMMUNITY): Payer: Self-pay | Admitting: Obstetrics & Gynecology

## 2024-05-05 ENCOUNTER — Other Ambulatory Visit: Payer: Self-pay

## 2024-05-05 DIAGNOSIS — Z793 Long term (current) use of hormonal contraceptives: Secondary | ICD-10-CM | POA: Insufficient documentation

## 2024-05-05 DIAGNOSIS — N938 Other specified abnormal uterine and vaginal bleeding: Secondary | ICD-10-CM

## 2024-05-05 DIAGNOSIS — D259 Leiomyoma of uterus, unspecified: Secondary | ICD-10-CM

## 2024-05-05 DIAGNOSIS — N939 Abnormal uterine and vaginal bleeding, unspecified: Secondary | ICD-10-CM | POA: Insufficient documentation

## 2024-05-05 DIAGNOSIS — D219 Benign neoplasm of connective and other soft tissue, unspecified: Secondary | ICD-10-CM

## 2024-05-05 DIAGNOSIS — Z87891 Personal history of nicotine dependence: Secondary | ICD-10-CM | POA: Insufficient documentation

## 2024-05-05 DIAGNOSIS — N8003 Adenomyosis of the uterus: Secondary | ICD-10-CM | POA: Diagnosis not present

## 2024-05-05 DIAGNOSIS — N920 Excessive and frequent menstruation with regular cycle: Secondary | ICD-10-CM

## 2024-05-05 DIAGNOSIS — N858 Other specified noninflammatory disorders of uterus: Secondary | ICD-10-CM | POA: Diagnosis not present

## 2024-05-05 DIAGNOSIS — Z01818 Encounter for other preprocedural examination: Secondary | ICD-10-CM

## 2024-05-05 HISTORY — PX: HYSTERECTOMY, TOTAL, LAPAROSCOPIC, ROBOT-ASSISTED WITH SALPINGECTOMY: SHX7587

## 2024-05-05 LAB — ABO/RH: ABO/RH(D): AB POS

## 2024-05-05 SURGERY — HYSTERECTOMY, TOTAL, LAPAROSCOPIC, ROBOT-ASSISTED WITH SALPINGECTOMY
Anesthesia: General | Site: Abdomen | Laterality: Bilateral

## 2024-05-05 MED ORDER — DEXAMETHASONE SODIUM PHOSPHATE 10 MG/ML IJ SOLN
INTRAMUSCULAR | Status: DC | PRN
  Administered 2024-05-05: 8 mg via INTRAVENOUS

## 2024-05-05 MED ORDER — ACETAMINOPHEN 10 MG/ML IV SOLN
1000.0000 mg | Freq: Once | INTRAVENOUS | Status: AC
  Administered 2024-05-05: 1000 mg via INTRAVENOUS
  Filled 2024-05-05: qty 100

## 2024-05-05 MED ORDER — OXYCODONE HCL 5 MG PO TABS: 5.0000 mg | ORAL_TABLET | Freq: Four times a day (QID) | ORAL | 0 refills | Status: AC | PRN

## 2024-05-05 MED ORDER — ONDANSETRON 4 MG PO TBDP: 4.0000 mg | ORAL_TABLET | Freq: Three times a day (TID) | ORAL | 0 refills | Status: AC | PRN

## 2024-05-05 MED ORDER — DEXMEDETOMIDINE HCL IN NACL 80 MCG/20ML IV SOLN
INTRAVENOUS | Status: DC | PRN
  Administered 2024-05-05: 20 ug via INTRAVENOUS

## 2024-05-05 MED ORDER — MIDAZOLAM HCL 2 MG/2ML IJ SOLN
INTRAMUSCULAR | Status: DC | PRN
  Administered 2024-05-05: 2 mg via INTRAVENOUS

## 2024-05-05 MED ORDER — BUPIVACAINE HCL (PF) 0.25 % IJ SOLN
INTRAMUSCULAR | Status: AC
  Filled 2024-05-05: qty 60

## 2024-05-05 MED ORDER — ROCURONIUM BROMIDE 10 MG/ML (PF) SYRINGE
PREFILLED_SYRINGE | INTRAVENOUS | Status: DC | PRN
  Administered 2024-05-05: 10 mg via INTRAVENOUS
  Administered 2024-05-05: 100 mg via INTRAVENOUS
  Administered 2024-05-05 (×2): 10 mg via INTRAVENOUS
  Administered 2024-05-05: 20 mg via INTRAVENOUS
  Administered 2024-05-05 (×2): 10 mg via INTRAVENOUS

## 2024-05-05 MED ORDER — HEMOSTATIC AGENTS (NO CHARGE) OPTIME
TOPICAL | Status: DC | PRN
  Administered 2024-05-05: 1 via TOPICAL

## 2024-05-05 MED ORDER — ACETAMINOPHEN 325 MG PO TABS
650.0000 mg | ORAL_TABLET | Freq: Four times a day (QID) | ORAL | Status: AC | PRN
Start: 2024-05-05 — End: 2024-06-04

## 2024-05-05 MED ORDER — SUGAMMADEX SODIUM 200 MG/2ML IV SOLN
INTRAVENOUS | Status: DC | PRN
  Administered 2024-05-05: 200 mg via INTRAVENOUS

## 2024-05-05 MED ORDER — FENTANYL CITRATE (PF) 250 MCG/5ML IJ SOLN
INTRAMUSCULAR | Status: AC
  Filled 2024-05-05: qty 5

## 2024-05-05 MED ORDER — LACTATED RINGERS IV SOLN: INTRAVENOUS | Status: DC

## 2024-05-05 MED ORDER — ORAL CARE MOUTH RINSE
15.0000 mL | Freq: Once | OROMUCOSAL | Status: AC
Start: 2024-05-05 — End: 2024-05-05

## 2024-05-05 MED ORDER — OXYCODONE HCL 5 MG PO TABS
5.0000 mg | ORAL_TABLET | Freq: Once | ORAL | Status: AC | PRN
  Administered 2024-05-05: 5 mg via ORAL
  Filled 2024-05-05: qty 1

## 2024-05-05 MED ORDER — CEFAZOLIN SODIUM-DEXTROSE 2-4 GM/100ML-% IV SOLN
2.0000 g | INTRAVENOUS | Status: AC
  Administered 2024-05-05 (×2): 2 g via INTRAVENOUS
  Filled 2024-05-05: qty 100

## 2024-05-05 MED ORDER — ONDANSETRON HCL 4 MG/2ML IJ SOLN
INTRAMUSCULAR | Status: AC
Start: 2024-05-05 — End: 2024-05-05
  Filled 2024-05-05: qty 2

## 2024-05-05 MED ORDER — CHLORHEXIDINE GLUCONATE 0.12 % MT SOLN
15.0000 mL | Freq: Once | OROMUCOSAL | Status: AC
Start: 2024-05-05 — End: 2024-05-05
  Administered 2024-05-05: 15 mL via OROMUCOSAL

## 2024-05-05 MED ORDER — PROPOFOL 500 MG/50ML IV EMUL
INTRAVENOUS | Status: AC
  Filled 2024-05-05: qty 50

## 2024-05-05 MED ORDER — PROPOFOL 10 MG/ML IV BOLUS
INTRAVENOUS | Status: DC | PRN
  Administered 2024-05-05: 200 mg via INTRAVENOUS
  Administered 2024-05-05: 25 ug/kg/min via INTRAVENOUS

## 2024-05-05 MED ORDER — CIPROFLOXACIN HCL 500 MG PO TABS: 500.0000 mg | ORAL_TABLET | Freq: Two times a day (BID) | ORAL | 0 refills | Status: AC

## 2024-05-05 MED ORDER — OXYCODONE HCL 5 MG/5ML PO SOLN: 5.0000 mg | Freq: Once | ORAL | Status: AC | PRN

## 2024-05-05 MED ORDER — HYDROMORPHONE HCL 1 MG/ML IJ SOLN
INTRAMUSCULAR | Status: DC | PRN
  Administered 2024-05-05 (×2): .5 mg via INTRAVENOUS

## 2024-05-05 MED ORDER — FENTANYL CITRATE (PF) 100 MCG/2ML IJ SOLN
INTRAMUSCULAR | Status: DC | PRN
  Administered 2024-05-05: 100 ug via INTRAVENOUS
  Administered 2024-05-05 (×3): 50 ug via INTRAVENOUS

## 2024-05-05 MED ORDER — POVIDONE-IODINE 10 % EX SWAB
2.0000 | Freq: Once | CUTANEOUS | Status: AC
  Administered 2024-05-05: 2 via TOPICAL

## 2024-05-05 MED ORDER — HYDROMORPHONE HCL 1 MG/ML IJ SOLN
INTRAMUSCULAR | Status: AC
  Filled 2024-05-05: qty 0.5

## 2024-05-05 MED ORDER — DEXMEDETOMIDINE HCL IN NACL 80 MCG/20ML IV SOLN
INTRAVENOUS | Status: AC
  Filled 2024-05-05: qty 20

## 2024-05-05 MED ORDER — SODIUM CHLORIDE (PF) 0.9 % IJ SOLN
INTRAMUSCULAR | Status: AC
  Filled 2024-05-05: qty 10

## 2024-05-05 MED ORDER — CEFAZOLIN SODIUM 1 G IJ SOLR
INTRAMUSCULAR | Status: AC
  Filled 2024-05-05: qty 20

## 2024-05-05 MED ORDER — ONDANSETRON HCL 4 MG/2ML IJ SOLN
INTRAMUSCULAR | Status: DC | PRN
  Administered 2024-05-05: 4 mg via INTRAVENOUS

## 2024-05-05 MED ORDER — GABAPENTIN 300 MG PO CAPS: 300.0000 mg | ORAL_CAPSULE | Freq: Three times a day (TID) | ORAL | 0 refills | Status: AC

## 2024-05-05 MED ORDER — BUPIVACAINE HCL (PF) 0.25 % IJ SOLN
INTRAMUSCULAR | Status: DC | PRN
  Administered 2024-05-05: 40 mL

## 2024-05-05 MED ORDER — MIDAZOLAM HCL 2 MG/2ML IJ SOLN
INTRAMUSCULAR | Status: AC
  Filled 2024-05-05: qty 2

## 2024-05-05 MED ORDER — ROCURONIUM BROMIDE 10 MG/ML (PF) SYRINGE
PREFILLED_SYRINGE | INTRAVENOUS | Status: AC
  Filled 2024-05-05: qty 10

## 2024-05-05 MED ORDER — DOCUSATE SODIUM 100 MG PO CAPS: 100.0000 mg | ORAL_CAPSULE | Freq: Two times a day (BID) | ORAL | 0 refills | Status: AC

## 2024-05-05 MED ORDER — STERILE WATER FOR IRRIGATION IR SOLN
Status: DC | PRN
  Administered 2024-05-05: 500 mL

## 2024-05-05 MED ORDER — FENTANYL CITRATE PF 50 MCG/ML IJ SOSY
25.0000 ug | PREFILLED_SYRINGE | INTRAMUSCULAR | Status: DC | PRN
  Administered 2024-05-05 (×2): 50 ug via INTRAVENOUS
  Filled 2024-05-05 (×2): qty 1

## 2024-05-05 MED ORDER — KETOROLAC TROMETHAMINE 10 MG PO TABS: 10.0000 mg | ORAL_TABLET | Freq: Three times a day (TID) | ORAL | 0 refills | Status: AC

## 2024-05-05 MED ORDER — KETOROLAC TROMETHAMINE 30 MG/ML IJ SOLN
30.0000 mg | INTRAMUSCULAR | Status: AC
  Administered 2024-05-05: 30 mg via INTRAVENOUS
  Filled 2024-05-05: qty 1

## 2024-05-05 MED ORDER — PHENYLEPHRINE 80 MCG/ML (10ML) SYRINGE FOR IV PUSH (FOR BLOOD PRESSURE SUPPORT)
PREFILLED_SYRINGE | INTRAVENOUS | Status: AC
  Filled 2024-05-05: qty 10

## 2024-05-05 MED ORDER — DEXAMETHASONE SODIUM PHOSPHATE 10 MG/ML IJ SOLN
INTRAMUSCULAR | Status: AC
Start: 2024-05-05 — End: 2024-05-05
  Filled 2024-05-05: qty 1

## 2024-05-05 MED ORDER — LIDOCAINE HCL (CARDIAC) PF 100 MG/5ML IV SOSY
PREFILLED_SYRINGE | INTRAVENOUS | Status: DC | PRN
  Administered 2024-05-05: 100 mg via INTRATRACHEAL

## 2024-05-05 MED ORDER — LIDOCAINE 2% (20 MG/ML) 5 ML SYRINGE
INTRAMUSCULAR | Status: AC
  Filled 2024-05-05: qty 5

## 2024-05-05 MED ORDER — PROPOFOL 10 MG/ML IV BOLUS
INTRAVENOUS | Status: AC
  Filled 2024-05-05: qty 20

## 2024-05-05 MED ORDER — ONDANSETRON HCL 4 MG/2ML IJ SOLN: 4.0000 mg | Freq: Once | INTRAMUSCULAR | Status: DC | PRN

## 2024-05-05 SURGICAL SUPPLY — 54 items
BAG URINE DRAIN 2000ML AR STRL (UROLOGICAL SUPPLIES) ×1 IMPLANT
BLADE SURG SZ10 CARB STEEL (BLADE) IMPLANT
BLADE SURG SZ11 CARB STEEL (BLADE) ×1 IMPLANT
CATH FOLEY 3WAY 30CC 16FR (CATHETERS) ×1 IMPLANT
CAUTERY HOOK MNPLR 1.6 DVNC XI (INSTRUMENTS) ×1 IMPLANT
CHLORAPREP W/TINT 26 (MISCELLANEOUS) ×1 IMPLANT
COVER LIGHT HANDLE (MISCELLANEOUS) IMPLANT
COVER MAYO STAND XLG (MISCELLANEOUS) ×1 IMPLANT
DERMABOND ADVANCED .7 DNX12 (GAUZE/BANDAGES/DRESSINGS) ×1 IMPLANT
DEVICE RETRIEVAL ALEXIS 14 (MISCELLANEOUS) IMPLANT
DRAPE ARM DVNC X/XI (DISPOSABLE) ×4 IMPLANT
DRAPE COLUMN DVNC XI (DISPOSABLE) ×1 IMPLANT
DRAPE UTILITY W/TAPE 26X15 (DRAPES) ×2 IMPLANT
DRIVER NDL MEGA 8 DVNC XI (INSTRUMENTS) ×2 IMPLANT
DRIVER NDLE MEGA DVNC XI (INSTRUMENTS) ×2 IMPLANT
ELECTRODE REM PT RTRN 9FT ADLT (ELECTROSURGICAL) ×1 IMPLANT
FORCEPS PROGRASP DVNC XI (FORCEP) ×1 IMPLANT
GAUZE 4X4 16PLY ~~LOC~~+RFID DBL (SPONGE) ×2 IMPLANT
GLOVE BIO SURGEON STRL SZ 6.5 (GLOVE) ×3 IMPLANT
GLOVE BIOGEL PI IND STRL 7.0 (GLOVE) ×6 IMPLANT
GOWN STRL REUS W/ TWL LRG LVL3 (GOWN DISPOSABLE) ×3 IMPLANT
GOWN STRL REUS W/TWL LRG LVL3 (GOWN DISPOSABLE) ×2 IMPLANT
KIT PINK PAD W/HEAD ARM REST (MISCELLANEOUS) ×1 IMPLANT
KIT TURNOVER CYSTO (KITS) ×1 IMPLANT
MANIFOLD NEPTUNE II (INSTRUMENTS) ×1 IMPLANT
NDL HYPO 25X1 1.5 SAFETY (NEEDLE) ×1 IMPLANT
NDL INSUFFLATION 14GA 120MM (NEEDLE) ×1 IMPLANT
NEEDLE HYPO 25X1 1.5 SAFETY (NEEDLE) ×1 IMPLANT
NEEDLE INSUFFLATION 14GA 120MM (NEEDLE) ×1 IMPLANT
NS IRRIG 500ML POUR BTL (IV SOLUTION) ×1 IMPLANT
OBTURATOR OPTICALSTD 8 DVNC (TROCAR) ×1 IMPLANT
PACK PERI GYN (CUSTOM PROCEDURE TRAY) ×1 IMPLANT
POSITIONER HEAD 8X9X4 ADT (SOFTGOODS) ×1 IMPLANT
POWDER SURGICEL 3.0 GRAM (HEMOSTASIS) ×1 IMPLANT
RETRACTOR WOUND ALXS 18CM MED (MISCELLANEOUS) IMPLANT
RUMI II GYRUS 3.5CM BLUE (DISPOSABLE) IMPLANT
SEAL UNIV 5-12 XI (MISCELLANEOUS) ×3 IMPLANT
SEALER VESSEL EXT DVNC XI (MISCELLANEOUS) ×1 IMPLANT
SET BASIN LINEN APH (SET/KITS/TRAYS/PACK) ×1 IMPLANT
SET TRI-LUMEN FLTR TB AIRSEAL (TUBING) ×1 IMPLANT
SOLUTION ANTFG W/FOAM PAD STRL (MISCELLANEOUS) IMPLANT
SPONGE T-LAP 18X18 ~~LOC~~+RFID (SPONGE) IMPLANT
SUT MNCRL AB 4-0 PS2 18 (SUTURE) ×1 IMPLANT
SUT STRATA PDS 0 30 CT-2.5 (SUTURE) ×2 IMPLANT
SUT VIC AB 0 CT1 27XBRD ANBCTR (SUTURE) IMPLANT
SUT VICRYL 0 UR6 27IN ABS (SUTURE) IMPLANT
SYR 10ML LL (SYRINGE) ×2 IMPLANT
SYR 20ML LL LF (SYRINGE) ×1 IMPLANT
SYR CONTROL 10ML LL (SYRINGE) ×2 IMPLANT
SYR TOOMEY 50ML (SYRINGE) IMPLANT
TIP ENDOSCOPIC SURGICEL (TIP) ×1 IMPLANT
TIP RUMI ORANGE 6.7MMX12CM (TIP) IMPLANT
TROCAR PORT AIRSEAL 8X120 (TROCAR) ×1 IMPLANT
WATER STERILE IRR 500ML POUR (IV SOLUTION) ×1 IMPLANT

## 2024-05-05 NOTE — Discharge Instructions (Addendum)
 Post Operative Pain Med Plan:  >Take gabapentin  300 mg three times per day, as prescribed for 4 days, try to space them evenly  >Take the Toradol  every 8 hours for the first 3 days.  After the toradol  is gone switch to over the counter Ibuprofen 600mg  every 6 hours as needed.  DO NOT TAKE the Toradol  and Ibuprofen together- take one or the other.  >In between the Toradol , take Tylenol  (over the counter) every 6 to 8 hours.  If the Tylenol  does not seem strong enough.  Take the tylenol  along with the oxycodone  every 6 hours.  If the oxycodone  seems to strong then just take Tylenol   >Oxycodone  will cause constipation, please be sure to take a stool softener (Colace) twice daily while taking this pain medication and/or continue this medication until your bowel regimen returns to normal  If possible try to take the Toradol  or Ibuprofen with food to help avoid upsetting your stomach  >Use a heating pad as well as needed  >I have also sent a prescription for zofran  (ondansetron ) for nausea to take if needed over the first couple of days  >Be gentle with your diet the first few days, liquids and soft non spicy food, fruits are great  >Get up and move, no lifting or straining  HOME INSTRUCTIONS  Please note any unusual or excessive bleeding, pain, swelling. Mild dizziness or drowsiness are normal for about 24 hours after surgery.   Shower when comfortable  Restrictions: No driving for 24 hours or while taking pain medications.  Infection prevent: A prescription of Cipro  has been sent in for you to take twice daily for the next 3 days to prevent infection.  Activity:  No heavy lifting (> 20 lbs), nothing in vagina (no tampons, douching, or intercourse) x 8 weeks; no tub baths for 8 weeks Vaginal spotting is expected but if your bleeding is heavy, period like,  please call the office   Incision: the bandaids will fall off when they are ready to; you may clean your incision with mild soap  and water  but do not rub or scrub the incision site.  You may experience slight bloody drainage from your incision periodically.  This is normal.  If you experience a large amount of drainage or the incision opens, please call your physician who will likely direct you to the emergency department.  Diet:  You may return to your regular diet.  Do not eat large meals.  Eat small frequent meals throughout the day.  Continue to drink a good amount of water  at least 6-8 glasses of water  per day, hydration is very important for the healing process.  Pain Management:  Follow the instructions as above.  Always take prescription pain medication with food.  Oxycodone  may cause constipation, you may want to take a stool softener while taking this medication.  A prescription of colace has been sent in to take twice daily if needed while taking the oxycodone .  Be sure to drink plenty of fluids and increase your fiber to help with constipation.  Alcohol -- Avoid for 24 hours and while taking pain medications.  Nausea: Take sips of ginger ale or soda  Fever -- Call physician if temperature over 101 degrees  Follow up:  If you do not already have a follow up appointment scheduled, please call the office at [phone].  If you experience fever (a temperature greater than 100.4), pain unrelieved by pain medication, shortness of breath, swelling of a single leg,  or any other symptoms which are concerning to you please the office immediately.

## 2024-05-05 NOTE — Anesthesia Preprocedure Evaluation (Signed)
 Anesthesia Evaluation  Patient identified by MRN, date of birth, ID band Patient awake    Reviewed: Allergy & Precautions, H&P , NPO status , Patient's Chart, lab work & pertinent test results, reviewed documented beta blocker date and time   Airway Mallampati: II  TM Distance: >3 FB Neck ROM: full    Dental no notable dental hx.    Pulmonary neg pulmonary ROS, former smoker   Pulmonary exam normal breath sounds clear to auscultation       Cardiovascular Exercise Tolerance: Good hypertension, negative cardio ROS  Rhythm:regular Rate:Normal     Neuro/Psych  PSYCHIATRIC DISORDERS Anxiety     negative neurological ROS     GI/Hepatic negative GI ROS, Neg liver ROS,,,  Endo/Other  negative endocrine ROS    Renal/GU negative Renal ROS  negative genitourinary   Musculoskeletal   Abdominal   Peds  Hematology negative hematology ROS (+)   Anesthesia Other Findings   Reproductive/Obstetrics negative OB ROS                              Anesthesia Physical Anesthesia Plan  ASA: 2  Anesthesia Plan: General and General ETT   Post-op Pain Management:    Induction:   PONV Risk Score and Plan: Ondansetron   Airway Management Planned:   Additional Equipment:   Intra-op Plan:   Post-operative Plan:   Informed Consent: I have reviewed the patients History and Physical, chart, labs and discussed the procedure including the risks, benefits and alternatives for the proposed anesthesia with the patient or authorized representative who has indicated his/her understanding and acceptance.     Dental Advisory Given  Plan Discussed with: CRNA  Anesthesia Plan Comments:         Anesthesia Quick Evaluation

## 2024-05-05 NOTE — Anesthesia Procedure Notes (Signed)
 Procedure Name: Intubation Date/Time: 05/05/2024 7:38 AM  Performed by: Pheobe Adine CROME, CRNAPre-anesthesia Checklist: Patient identified, Emergency Drugs available, Suction available, Patient being monitored and Timeout performed Patient Re-evaluated:Patient Re-evaluated prior to induction Oxygen Delivery Method: Circle system utilized Preoxygenation: Pre-oxygenation with 100% oxygen Induction Type: IV induction Ventilation: Mask ventilation without difficulty Laryngoscope Size: Mac and 3 Grade View: Grade I Tube type: Oral Tube size: 7.0 mm Number of attempts: 1 Airway Equipment and Method: Stylet and Video-laryngoscopy Placement Confirmation: ETT inserted through vocal cords under direct vision, positive ETCO2, CO2 detector and breath sounds checked- equal and bilateral Secured at: 21 cm Tube secured with: Tape Dental Injury: Teeth and Oropharynx as per pre-operative assessment  Comments: Paramedic student intubated

## 2024-05-05 NOTE — Transfer of Care (Signed)
 Immediate Anesthesia Transfer of Care Note  Patient: Nichole Cain  Procedure(s) Performed: HYSTERECTOMY, TOTAL, LAPAROSCOPIC, ROBOT-ASSISTED WITH SALPINGECTOMY (Bilateral: Abdomen)  Patient Location: PACU  Anesthesia Type:General  Level of Consciousness: awake, alert , oriented, and patient cooperative  Airway & Oxygen Therapy: Patient Spontanous Breathing and Patient connected to face mask oxygen  Post-op Assessment: Report given to RN and Post -op Vital signs reviewed and stable  Post vital signs: Reviewed and stable  Last Vitals:  Vitals Value Taken Time  BP 124/60 05/05/24 11:20  Temp    Pulse 78 05/05/24 11:25  Resp 24 05/05/24 11:25  SpO2 100 % 05/05/24 11:25  Vitals shown include unfiled device data.  Last Pain:  Vitals:   05/05/24 0722  TempSrc:   PainSc: 0-No pain      Patients Stated Pain Goal: 3 (05/05/24 0654)  Complications: No notable events documented.

## 2024-05-05 NOTE — Op Note (Signed)
 Pre Op Dx:   Abnormal uterine bleeding Uterine fibroids  Post Op Dx:   same  Procedure:   Robotic Assisted Total Laparoscopic Hysterectomy and Bilateral Salpingectomy   Surgeon:  Dr. Delon Prude Assistants:  Earnestine Sharps, RNFA Anesthesia:  general   EBL:  100cc  IVF:  1300cc UOP:  100cc   Drains:  Foley catheter Specimen removed:  Uterus with cervix, Mirena  IUD and bilateral fallopian tubes Findings:  Enlarged uterus with multiple fibroids- some pedunculated some within uteirne body.  Normal bilateral fallopian tubes and ovaries Complications: None  Description of procedure:  After informed consent the patient was taken to the operating room and placed in dorsal supine position where general endotracheal anesthesia was administered and found to be adequate.  She was placed in dorsal lithotomy position with her arms tucked.  She was prepped and draped in the usual sterile fashion.  A timeout was called and the procedure confirmed.  A RUMI uterine manipulator with the Koh cup and a Foley catheter were placed.   An incision was made in the supraumbilical area and the Veress needle was inserted into the abdominal cavity without difficulty. Proper placement was confirmed using the saline drop test and opening pressure was 2 mmHg. A pneumoperitoneum was obtained. The laparoscopic trocar and the laparoscope were placed under direct visualization.  Three additional 7mm ports were placed on either side of the umbilicus and an 8mm port was placed in the left upper quadrant under direct visualization.  The patient was placed in Trendelenburg position and the [employer] robotic device was docked.  Next, attention was turned to the console where the hysterectomy was performed.  The right fallopian tube was divided at the mesosalpinx and the uteroovarian anastamosis was divided and the right round ligament was divided.  This process was repeated on the contralateral side.  The anterior leaflet of the  broad ligament was divided to create a bladder flap. On the patient's left side there was an area of adhesion of the left adnexa to the bowel- care was taken to stay close to the uterus and avoid the adhesions.  The uterine artery and vein were skeletonized and desiccated superior to the Koh cup.  This process was repeated on the contralateral side.  An anterior colpotomy incision was made and the colpotomy was completed along the ridge of the Koh cup.  Due to the large size of the uterus, vaginal removal was attempted but unsuccessful.  A large bag was passed through the vagina and the specimen was placed within the bag.  The vaginal occluder was placed in the vagina to maintain pneumoperitoneum.  The vaginal cuff was then closed with V-loc suture in a double layer fashion. Hemostasis confirmed. Arista powder was placed on the vaginal cuff and adnexa bilaterally.  The [employer] Vinc[employer] robotic device was undocked.  Under direct visualization TAP block was completed under direct visualization using 10cc of 0.25% marcaine  in each of four locations.  The supraumbilical incision was the extended-measuring about 2.5cm to allow for a small Alexis.  The bag was then brought through the [place].  The specimen was then morcellated and sent to pathology.  Airseal was deflated and trocars were removed.The skin was closed with 4-0 Vicryl in subcuticular fashion with skin glue placed atop each port site.    The patient was returned to dorsal supine position, awakened and extubated in the OR having appeared to tolerate the procedure well.  All sponge, needle, and instrument counts were correct x  2 at the end of the case.  Pt tolerated procedure well and was taken to recovery in stable condition.   Gerold Sar, DO Attending Obstetrician & Gynecologist, [facility] for [employer], [facility]

## 2024-05-06 ENCOUNTER — Encounter (HOSPITAL_COMMUNITY): Payer: Self-pay | Admitting: Obstetrics & Gynecology

## 2024-05-07 NOTE — Anesthesia Postprocedure Evaluation (Signed)
 Anesthesia Post Note  Patient: Nichole Cain  Procedure(s) Performed: HYSTERECTOMY, TOTAL, LAPAROSCOPIC, ROBOT-ASSISTED WITH SALPINGECTOMY (Bilateral: Abdomen)  Patient location during evaluation: Phase II Anesthesia Type: General Level of consciousness: awake Pain management: pain level controlled Vital Signs Assessment: post-procedure vital signs reviewed and stable Respiratory status: spontaneous breathing and respiratory function stable Cardiovascular status: blood pressure returned to baseline and stable Postop Assessment: no headache and no apparent nausea or vomiting Anesthetic complications: no Comments: Late entry   No notable events documented.   Last Vitals:  Vitals:   05/05/24 1243 05/05/24 1245  BP: 111/75 111/75  Pulse: 77 74  Resp: 19 19  Temp:    SpO2: 98% 97%    Last Pain:  Vitals:   05/06/24 1411  TempSrc:   PainSc: 2                  [name]

## 2024-05-08 ENCOUNTER — Ambulatory Visit: Payer: Self-pay | Admitting: Obstetrics & Gynecology

## 2024-05-08 LAB — SURGICAL PATHOLOGY

## 2024-05-12 ENCOUNTER — Telehealth: Payer: Self-pay | Admitting: Obstetrics & Gynecology

## 2024-05-12 ENCOUNTER — Inpatient Hospital Stay (HOSPITAL_COMMUNITY)
Admission: AD | Admit: 2024-05-12 | Discharge: 2024-05-12 | Disposition: A | Discharge status: Home / Self Care | Attending: Obstetrics and Gynecology | Admitting: Obstetrics and Gynecology

## 2024-05-12 ENCOUNTER — Other Ambulatory Visit: Payer: Self-pay

## 2024-05-12 DIAGNOSIS — Z9889 Other specified postprocedural states: Secondary | ICD-10-CM | POA: Diagnosis not present

## 2024-05-12 DIAGNOSIS — N939 Abnormal uterine and vaginal bleeding, unspecified: Secondary | ICD-10-CM | POA: Insufficient documentation

## 2024-05-12 DIAGNOSIS — Z90710 Acquired absence of both cervix and uterus: Secondary | ICD-10-CM | POA: Diagnosis not present

## 2024-05-12 NOTE — Discharge Instructions (Signed)
 You were seen at the MAU for vaginal bleeding. You are not having a surgical complication. Your vaginal cuff -- the area they sewed together-- was intact and there was minimal to no blood on your exam.   This is reassuring. Please keep your appointment with Dr. Ozan this Thursday.   Return to care for bright red bleeding, severe abdominal pain, fevers, chills.

## 2024-05-12 NOTE — MAU Note (Addendum)
 MAU Triage Note  Nichole Cain is a 38 y.o. a GYN patient here in MAU reporting: pt reports she had a hysterectomy on Tuesday. She woke up in a pool of bright red blood this morning around 0400. Reports it's now pink, comes in gushes, she's been using panty liners and has been changing them about q72min-1hour. Reports some nausea and gas pain. Took a zofran  about 1.5hours ago.    Onset of complaint: 0400 Pain score: 4/10 gas Vitals:   05/12/24 1559  BP: 128/83  Pulse: 96  Resp: 18  Temp: 98.4 F (36.9 C)  SpO2: 100%      Lab orders placed from triage: provider @bedside  for pelvic exam

## 2024-05-12 NOTE — MAU Provider Note (Signed)
 History     CSN: [id number]  Arrival date and time: 05/12/24 1520   Event Date/Time   First Provider Initiated Contact with Patient 05/12/24 [phone]      Chief Complaint  Patient presents with   Vaginal Bleeding   Vaginal Bleeding Pertinent negatives include no abdominal pain, back pain, chills, diarrhea, dysuria, fever, flank pain, nausea, rash, sore throat or vomiting.   Nichole Cain is a 38 y.o. G1P0010 s/p robotic assisted lap hysterectomy with salpingectomy presenting with bleeding from vagina. Reports waking up in a pool of blood and then had continued to have pink discharge. She reports some nausea today and she took zofran  which helped and she no longer has this. Reports minimal abdominal pain. She reports intermittent sharp pains but attributes to gas pain as she is not having regular BMs. She is overall not concerned about this pain. She was concerned about the bleeding. She notes using a panty liner since the initial bleeding event. Denies anything protruding from vagina, pressure or other vaginal concerns.   OB History     Gravida  1   Para      Term      Preterm      AB  1   Living         SAB      IAB  1   Ectopic      Multiple      Live Births              Past Medical History:  Diagnosis Date   Allergy    Anxiety    Hearing loss    wears hearing aids b/l    Sinusitis    right maxillary    Past Surgical History:  Procedure Laterality Date   HYSTERECTOMY, TOTAL, LAPAROSCOPIC, ROBOT-ASSISTED WITH SALPINGECTOMY Bilateral 05/05/2024   Procedure: HYSTERECTOMY, TOTAL, LAPAROSCOPIC, ROBOT-ASSISTED WITH SALPINGECTOMY;  Surgeon: Marilynn Nest, DO;  Location: [facility];  Service: Gynecology;  Laterality: Bilateral;   INNER EAR SURGERY     tubes x 9 and skin grafts in ear drums   MYOMECTOMY      Family History  Problem Relation Age of Onset   Anxiety disorder Mother    Depression Mother    Heart disease Mother        MI   Hyperlipidemia Mother     Hypertension Mother    Heart disease Maternal Grandfather        CHF   Cancer Paternal Grandfather        testicular cancer    Social History   Tobacco Use   Smoking status: Former    Current packs/day: 0.50    Average packs/day: 0.5 packs/day for 17.0 years (8.5 ttl pk-yrs)    Types: Cigarettes   Smokeless tobacco: Never   Tobacco comments:    smoker since 20s to 2017 10+ years max 1 pk lasting 2 days to 1 ppd   Vaping Use   Vaping status: Former  Substance Use Topics   Alcohol use: Yes    Comment: occasionally    Drug use: Not Currently    Allergies: No Known Allergies  Medications Prior to Admission  Medication Sig Dispense Refill Last Dose/Taking   ondansetron  (ZOFRAN -ODT) 4 MG disintegrating tablet Take 1 tablet (4 mg total) by mouth every 8 (eight) hours as needed. 20 tablet 0 05/12/2024 at  2:00 PM   acetaminophen  (TYLENOL ) 325 MG tablet Take 2 tablets (650 mg total) by mouth every 6 (six) hours as needed  for moderate pain (pain score 4-6).      cetirizine (ZYRTEC) 10 MG tablet Take 10 mg by mouth daily.      docusate sodium  (COLACE) 100 MG capsule Take 1 capsule (100 mg total) by mouth 2 (two) times daily for 10 days. 20 capsule 0    gabapentin  (NEURONTIN ) 300 MG capsule Take 1 capsule (300 mg total) by mouth 3 (three) times daily for 4 days. 12 capsule 0    oxyCODONE  (OXY IR/ROXICODONE ) 5 MG immediate release tablet Take 1 tablet (5 mg total) by mouth every 6 (six) hours as needed for up to 7 days. 26 tablet 0     Review of Systems  Constitutional:  Negative for chills and fever.  HENT:  Negative for congestion and sore throat.   Eyes:  Negative for pain and visual disturbance.  Respiratory:  Negative for cough, chest tightness and shortness of breath.   Cardiovascular:  Negative for chest pain.  Gastrointestinal:  Negative for abdominal pain, diarrhea, nausea and vomiting.  Endocrine: Negative for cold intolerance and heat intolerance.  Genitourinary:  Positive  for vaginal bleeding. Negative for dysuria and flank pain.  Musculoskeletal:  Negative for back pain.  Skin:  Negative for rash.  Allergic/Immunologic: Negative for food allergies.  Neurological:  Negative for dizziness and light-headedness.  Psychiatric/Behavioral:  Negative for agitation.    Physical Exam   Blood pressure 128/83, pulse 96, temperature 98.4 F (36.9 C), temperature source Oral, resp. rate 18, height 5' 4 (1.626 m), weight 93 kg, last menstrual period 02/19/2024, SpO2 100%.  Physical Exam Vitals and nursing note reviewed. Exam conducted with a chaperone present.  Constitutional:      General: She is not in acute distress.    Appearance: She is well-developed.  HENT:     Head: Normocephalic and atraumatic.  Eyes:     General: No scleral icterus.    Conjunctiva/sclera: Conjunctivae normal.  Cardiovascular:     Rate and Rhythm: Normal rate.  Pulmonary:     Effort: Pulmonary effort is normal.  Chest:     Chest wall: No tenderness.  Abdominal:     Palpations: Abdomen is soft.     Tenderness: There is no abdominal tenderness. There is no guarding or rebound.  Genitourinary:    Exam position: Lithotomy position.     Labia:        Right: No rash or lesion.        Left: No rash or lesion.      Vagina: Bleeding (pink discharge but no bright red bleeding) present.     Uterus: Absent.      Comments: There is no birth red blood in vault. Visualized cuff with speculum and there was no disruption. No granulation tissue present or friable tissue.    Using a gloved hand I gently palpated the suture line and there was no disruption of the vaginal cuff. No TTP and no blood on my glove after.   Musculoskeletal:        General: Normal range of motion.     Cervical back: Normal range of motion and neck supple.  Skin:    General: Skin is warm and dry.     Findings: No rash.  Neurological:     Mental Status: She is alert and oriented to person, place, and time.     MAU  Course  Procedures  MDM- low  Sent due to concern for wound dehiscence. Exam is very reassuring with no visible or palpable  disruption of the vaginal cuff. No blood seen in vaginal vault. Mostly likely had clot on the suture line that was released with no signs of continued bleeding on exam.   Assessment and Plan   1. Postoperative state   2. S/P hysterectomy    No evidence of cuff dehiscence Discharged home in stable condition Follow up scheduled this week with primary surgery Dr. Ozan per below.   Future Appointments  Date Time Provider Department Center  05/14/2024 11:50 AM Ozan, Jennifer, DO [facility]     [id number] [place] 05/12/2024, 4:25 PM

## 2024-05-12 NOTE — Telephone Encounter (Signed)
 Received message from office regarding pt concern about vaginal spotting Currently I'm not in office so I called pt Initially noted some BRB then pink spotting that has continued.  Pt advised to go in to hospital for further evaluation.  Discussed with patient concern for dehiscence and need for pelvic exam Pt notes that she would head that way  Erinn Mendosa, DO Attending Obstetrician & Gynecologist, Faculty Practice Center for [facility], [facility]

## 2024-05-14 ENCOUNTER — Encounter: Payer: Self-pay | Admitting: Obstetrics & Gynecology

## 2024-05-14 ENCOUNTER — Ambulatory Visit: Admitting: Obstetrics & Gynecology

## 2024-05-14 VITALS — BP 125/85 | HR 70 | Ht 64.0 in | Wt 206.2 lb

## 2024-05-14 DIAGNOSIS — Z4889 Encounter for other specified surgical aftercare: Secondary | ICD-10-CM

## 2024-05-14 NOTE — Progress Notes (Signed)
    PostOp Visit Note  Nichole Cain is a 38 y.o. G97P0010 female who presents for a postoperative visit. She is 1 week postop following a RAH, BS completed on 9/23   Today she notes that she is doing the best today that she has done.  Of note, pt called in reporting vaginal spotting and vaginal discharge/?Fluid.  Pt was seen in MAU- vaginal cuff appeared intact and on bimanual exam intact.  Today she notes that it stopped for a while and has only noted some light spotting on her pad.  Denies fever or chills.  Tolerating gen diet.  +Flatus.  She has had BMs, but struggling with constipation- initially on 2 BMs since surgery, but did have one yesterday. Overall doing well and reports no acute complaints   Review of Systems Pertinent items are noted in HPI.    Objective:  BP 125/85 (BP Location: Right Arm, Patient Position: Sitting, Cuff Size: Normal)   Pulse 70   Ht 5' 4 (1.626 m)   Wt 206 lb 3.2 oz (93.5 kg)   LMP 02/19/2024 (Approximate) Comment: Urine Pregnancy Test Negative as of 04/30/24.  BMI 35.39 kg/m    Physical Examination:  GENERAL ASSESSMENT: well developed and well nourished SKIN: warm and dry CHEST: normal air exchange, respiratory effort normal with no retractions HEART: regular rate and rhythm ABDOMEN: soft, non-distended, incisions healing appropriately, ecchymosis also healing around incisions  GU: normal external genital, pink vaginal mucosa- no bleeding or discharge noted.  On vaginal inspection cuff appears intact.  Minimal watery-pink discharge noted.  Cuff palpated with large Q-tip- no defect/dehiscence noted- appears intact EXTREMITY: no edema or calf tenderness PSYCH: mood appropriate, normal affect       Assessment:    Postop   Plan:   -meeting milestones appropriately - will continue to monitor vaginal spotting, cuff appears to be healing appropriately - pt to call with worsening of symptoms and will continue to monitor -f/u in 7wk for next  postop visit  Amar Sippel, DO Attending Obstetrician & Gynecologist, Faculty Practice Center for [employer], [facility]

## 2024-05-31 ENCOUNTER — Other Ambulatory Visit: Payer: Self-pay | Admitting: Obstetrics and Gynecology

## 2024-06-05 DIAGNOSIS — H903 Sensorineural hearing loss, bilateral: Secondary | ICD-10-CM | POA: Diagnosis not present

## 2024-07-02 ENCOUNTER — Ambulatory Visit: Admitting: Obstetrics & Gynecology

## 2024-07-02 ENCOUNTER — Encounter: Payer: Self-pay | Admitting: Obstetrics & Gynecology

## 2024-07-02 VITALS — BP 127/83 | HR 82 | Ht 64.0 in | Wt 209.8 lb

## 2024-07-02 DIAGNOSIS — Z4889 Encounter for other specified surgical aftercare: Secondary | ICD-10-CM

## 2024-07-02 DIAGNOSIS — Z1231 Encounter for screening mammogram for malignant neoplasm of breast: Secondary | ICD-10-CM

## 2024-07-02 NOTE — Progress Notes (Signed)
    PostOp Visit Note  Nichole Cain is a 38 y.o. G92P0010 female who presents for a postoperative visit. She is 8 weeks postop following a robotic assisted laparoscopic hysterectomy, bilateral salpingectomy completed on 9/23  Today she notes that she is doing great states that she is so relieved to no longer have bleeding or pelvic pain. Denies fever or chills.  Tolerating gen diet.  +Flatus, Regular BMs.  Pain is minimal, no longer taking any medication Overall doing well and reports no acute complaints   Review of Systems Pertinent items are noted in HPI.    Objective:  BP 127/83 (BP Location: Left Arm, Patient Position: Sitting, Cuff Size: Normal)   Pulse 82   Ht 5' 4 (1.626 m)   Wt 209 lb 12.8 oz (95.2 kg)   LMP 02/19/2024 (Approximate) Comment: Urine Pregnancy Test Negative as of 04/30/24.  BMI 36.01 kg/m    Physical Examination:  GENERAL ASSESSMENT: well developed and well nourished SKIN: normal color, no lesions CHEST: normal air exchange, respiratory effort normal with no retractions HEART: regular rate and rhythm ABDOMEN: soft, non-distended, incisions well-healed clean dry and intact GU: Normal external genitalia, vaginal pink moist mucosa, on visual exams cuff appears well-healed.  On bimanual exam vaginal cuff well-healed no defects or abnormalities noted EXTREMITY: no edema  PSYCH: mood appropriate, normal affect       Assessment:    Postop   Plan:   -Meeting milestones appropriately - May return to regular activity - Discussed that Pap smears are no longer indicated since she does not have a cervix - Plan to order mammogram since she will be 39 in April - Patient does not have PCP may return to our office or recommendation to establish care with PCP  Nichole Artley, DO Attending Obstetrician & Gynecologist, Faculty Practice Center for [facility], [facility]

## 2024-08-11 DIAGNOSIS — W19XXXA Unspecified fall, initial encounter: Secondary | ICD-10-CM | POA: Diagnosis not present

## 2024-08-11 DIAGNOSIS — S0993XA Unspecified injury of face, initial encounter: Secondary | ICD-10-CM | POA: Diagnosis not present

## 2024-08-11 DIAGNOSIS — R6884 Jaw pain: Secondary | ICD-10-CM | POA: Diagnosis not present

## 2024-08-11 DIAGNOSIS — M264 Malocclusion, unspecified: Secondary | ICD-10-CM | POA: Diagnosis not present

## 2024-08-11 DIAGNOSIS — S0081XA Abrasion of other part of head, initial encounter: Secondary | ICD-10-CM | POA: Diagnosis not present

## 2024-08-11 DIAGNOSIS — W000XXA Fall on same level due to ice and snow, initial encounter: Secondary | ICD-10-CM | POA: Diagnosis not present

## 2024-08-11 DIAGNOSIS — S01511A Laceration without foreign body of lip, initial encounter: Secondary | ICD-10-CM | POA: Diagnosis not present

## 2024-08-11 DIAGNOSIS — Y92009 Unspecified place in unspecified non-institutional (private) residence as the place of occurrence of the external cause: Secondary | ICD-10-CM | POA: Diagnosis not present
